# Patient Record
Sex: Female | Born: 1970 | Race: Black or African American | Hispanic: No | Marital: Married | Smoking: Never smoker
Health system: Southern US, Community
[De-identification: ages and names within clinical notes are randomized; demographics above are authoritative.]

## PROBLEM LIST (undated history)

## (undated) DIAGNOSIS — F419 Anxiety disorder, unspecified: Secondary | ICD-10-CM

## (undated) DIAGNOSIS — U071 COVID-19: Secondary | ICD-10-CM

## (undated) HISTORY — PX: LAPAROSCOPIC GASTRIC SLEEVE RESECTION: SHX5895

---

## 1999-06-03 HISTORY — PX: DILATION AND CURETTAGE OF UTERUS: SHX78

## 2004-06-02 HISTORY — PX: LIPOMA EXCISION: SHX5283

## 2007-07-22 ENCOUNTER — Encounter: Admission: RE | Admit: 2007-07-22 | Discharge: 2007-07-22 | Payer: Self-pay | Admitting: Internal Medicine

## 2007-09-25 ENCOUNTER — Emergency Department (HOSPITAL_COMMUNITY): Admission: EM | Admit: 2007-09-25 | Discharge: 2007-09-25 | Payer: Self-pay | Admitting: Emergency Medicine

## 2007-12-10 ENCOUNTER — Ambulatory Visit (HOSPITAL_COMMUNITY): Admission: RE | Admit: 2007-12-10 | Discharge: 2007-12-10 | Payer: Self-pay | Admitting: Obstetrics and Gynecology

## 2008-02-02 ENCOUNTER — Encounter: Admission: RE | Admit: 2008-02-02 | Discharge: 2008-02-02 | Payer: Self-pay | Admitting: Internal Medicine

## 2008-08-03 ENCOUNTER — Encounter: Admission: RE | Admit: 2008-08-03 | Discharge: 2008-08-03 | Payer: Self-pay | Admitting: Internal Medicine

## 2009-02-07 ENCOUNTER — Emergency Department (HOSPITAL_COMMUNITY): Admission: EM | Admit: 2009-02-07 | Discharge: 2009-02-07 | Payer: Self-pay | Admitting: Family Medicine

## 2010-06-24 ENCOUNTER — Encounter: Payer: Self-pay | Admitting: Internal Medicine

## 2010-07-09 ENCOUNTER — Other Ambulatory Visit: Payer: Self-pay | Admitting: Obstetrics and Gynecology

## 2010-07-09 DIAGNOSIS — N6320 Unspecified lump in the left breast, unspecified quadrant: Secondary | ICD-10-CM

## 2010-07-12 ENCOUNTER — Ambulatory Visit
Admission: RE | Admit: 2010-07-12 | Discharge: 2010-07-12 | Disposition: A | Discharge status: Home / Self Care | Payer: BC Managed Care – PPO | Source: Ambulatory Visit | Attending: Obstetrics and Gynecology | Admitting: Obstetrics and Gynecology

## 2010-07-12 DIAGNOSIS — N6320 Unspecified lump in the left breast, unspecified quadrant: Secondary | ICD-10-CM

## 2011-04-22 ENCOUNTER — Other Ambulatory Visit: Payer: Self-pay | Admitting: Obstetrics and Gynecology

## 2011-04-30 ENCOUNTER — Encounter (HOSPITAL_COMMUNITY): Payer: Self-pay | Admitting: Pharmacist

## 2011-05-07 ENCOUNTER — Encounter (HOSPITAL_COMMUNITY)
Admission: RE | Admit: 2011-05-07 | Discharge: 2011-05-07 | Disposition: A | Discharge status: Home / Self Care | Payer: BC Managed Care – PPO | Source: Ambulatory Visit | Attending: Obstetrics and Gynecology | Admitting: Obstetrics and Gynecology

## 2011-05-07 ENCOUNTER — Encounter (HOSPITAL_COMMUNITY): Payer: Self-pay

## 2011-05-07 HISTORY — DX: Anxiety disorder, unspecified: F41.9

## 2011-05-07 LAB — CBC
Hemoglobin: 11.5 g/dL — ABNORMAL LOW (ref 12.0–15.0)
MCHC: 33.8 g/dL (ref 30.0–36.0)
RBC: 4.6 MIL/uL (ref 3.87–5.11)

## 2011-05-07 LAB — SURGICAL PCR SCREEN
MRSA, PCR: NEGATIVE
Staphylococcus aureus: NEGATIVE

## 2011-05-07 NOTE — Patient Instructions (Addendum)
22 Katie Curry  05/07/2011   Your procedure is scheduled on:  05/14/11 09:45  Enter through the Main Entrance of [facility] at 8:15 AM.  Pick up the phone at the desk and dial [phone].   Call this number if you have problems the morning of surgery: [phone]   Remember:   Do not eat food:After Midnight.  Do not drink clear liquids: After Midnight.  Take these medicines the morning of surgery with A SIP OF WATER: NA   Do not wear jewelry, make-up or nail polish.  Do not wear lotions, powders, or perfumes. You may wear deodorant.  Do not shave 48 hours prior to surgery.  Do not bring valuables to the hospital.  Contacts, dentures or bridgework may not be worn into surgery.  Leave suitcase in the car. After surgery it may be brought to your room.  For patients admitted to the hospital, checkout time is 11:00 AM the day of discharge.   Patients discharged the day of surgery will not be allowed to drive home.  Name and phone number of your driver: NA  Special Instructions: CHG Shower Use Special Wash: 1/2 bottle night before surgery and 1/2 bottle morning of surgery.   Please read over the following fact sheets that you were given: MRSA Information

## 2011-05-13 MED ORDER — DEXTROSE 5 % IV SOLN
2.0000 g | INTRAVENOUS | Status: AC
  Administered 2011-05-14: 2 g via INTRAVENOUS
  Filled 2011-05-13: qty 2

## 2011-05-13 NOTE — H&P (Signed)
40 y.o. yo complains of SUI and cystocele. SUI getting worse.  Wearing pad all the time.  She leaks with sneeze, laughing and any type of exertion.  PT did not help.  Cystometrics done- showed LPP 62.  She also has urge symptoms but Gala Murdoch does not help with the leaking urine.    Past Medical History  Diagnosis Date  . Anxiety     no meds  PGYn:  Pt has hx of abnormal paps.  She has a Mirena in place. Past Surgical History  Procedure Date  . Lipoma excision 2006  . Dilation and curettage of uterus 2001    Missed AB    History   Social History  . Marital Status: Divorced    Spouse Name: N/A    Number of Children: N/A  . Years of Education: N/A   Occupational History  . Not on file.   Social History Main Topics  . Smoking status: Never Smoker   . Smokeless tobacco: Not on file  . Alcohol Use: Yes     occasionaly  . Drug Use: No  . Sexually Active:    Other Topics Concern  . Not on file   Social History Narrative  . No narrative on file    No current facility-administered medications on file prior to encounter.   No current outpatient prescriptions on file prior to encounter.    Allergies  Allergen Reactions  . Azithromycin Hives  . Food Hives and Swelling    [place] nuts    Vitals P  Lungs: clear to ascultation Cor:  RRR Abdomen:  soft, nontender, nondistended. Ex:  no cords, erythema Pelvic:  NEFG, nml s/s av uterus, no masses.  45 degree urethral movement.  Cystocele to -1.   Mild rectocele without splinting.  Uterus is suspended.  A:  SUI and mixed incontinence.  Desires definitive.  Does not desire fertility.     P:   All risks, benefits and alternatives d/w patient and she desires to proceed with a TVT and Arepair .  Pt will retain uterus if the uterus is still well suspended under anesthesia.  Pt understands will need to keep good birth control after operation (a future pregnancy could compromise repair) and she will need to continue regular   papsmears because of history of CIN I  if she retains her uterus. Patient  will receive preop antibiotics and SCDs during the operation.     Porche Steinberger A

## 2011-05-14 ENCOUNTER — Ambulatory Visit (HOSPITAL_COMMUNITY): Payer: BC Managed Care – PPO | Admitting: Anesthesiology

## 2011-05-14 ENCOUNTER — Encounter (HOSPITAL_COMMUNITY): Payer: Self-pay | Admitting: Anesthesiology

## 2011-05-14 ENCOUNTER — Other Ambulatory Visit: Payer: Self-pay | Admitting: Obstetrics and Gynecology

## 2011-05-14 ENCOUNTER — Ambulatory Visit (HOSPITAL_COMMUNITY)
Admission: RE | Admit: 2011-05-14 | Discharge: 2011-05-15 | Disposition: A | Discharge status: Home / Self Care | Payer: BC Managed Care – PPO | Source: Ambulatory Visit | Attending: Obstetrics and Gynecology | Admitting: Obstetrics and Gynecology

## 2011-05-14 ENCOUNTER — Encounter (HOSPITAL_COMMUNITY): Payer: Self-pay | Admitting: *Deleted

## 2011-05-14 ENCOUNTER — Encounter (HOSPITAL_COMMUNITY)
Admission: RE | Disposition: A | Discharge status: Home / Self Care | Payer: Self-pay | Source: Ambulatory Visit | Attending: Obstetrics and Gynecology

## 2011-05-14 DIAGNOSIS — N393 Stress incontinence (female) (male): Secondary | ICD-10-CM | POA: Insufficient documentation

## 2011-05-14 DIAGNOSIS — N812 Incomplete uterovaginal prolapse: Secondary | ICD-10-CM | POA: Insufficient documentation

## 2011-05-14 DIAGNOSIS — Z01818 Encounter for other preprocedural examination: Secondary | ICD-10-CM | POA: Insufficient documentation

## 2011-05-14 DIAGNOSIS — Z9889 Other specified postprocedural states: Secondary | ICD-10-CM

## 2011-05-14 DIAGNOSIS — Z01812 Encounter for preprocedural laboratory examination: Secondary | ICD-10-CM | POA: Insufficient documentation

## 2011-05-14 HISTORY — PX: CYSTOSCOPY: SHX5120

## 2011-05-14 HISTORY — PX: BLADDER SUSPENSION: SHX72

## 2011-05-14 HISTORY — PX: CYSTOCELE REPAIR: SHX163

## 2011-05-14 HISTORY — PX: VAGINAL HYSTERECTOMY: SHX2639

## 2011-05-14 LAB — HCG, SERUM, QUALITATIVE: Preg, Serum: NEGATIVE

## 2011-05-14 SURGERY — TRANSVAGINAL TAPE (TVT) PROCEDURE
Anesthesia: General | Site: Vagina | Wound class: Clean Contaminated

## 2011-05-14 MED ORDER — PROPOFOL 10 MG/ML IV EMUL
INTRAVENOUS | Status: DC | PRN
  Administered 2011-05-14: 200 mg via INTRAVENOUS

## 2011-05-14 MED ORDER — HYDROMORPHONE HCL PF 1 MG/ML IJ SOLN
INTRAMUSCULAR | Status: AC
  Administered 2011-05-14: 0.5 mg via INTRAVENOUS
  Filled 2011-05-14: qty 1

## 2011-05-14 MED ORDER — ONDANSETRON HCL 4 MG PO TABS: 4.0000 mg | ORAL_TABLET | Freq: Four times a day (QID) | ORAL | Status: DC | PRN

## 2011-05-14 MED ORDER — LIDOCAINE-EPINEPHRINE 0.5-1:200000 % IJ SOLN
INTRAMUSCULAR | Status: DC | PRN
  Administered 2011-05-14: 14 mL

## 2011-05-14 MED ORDER — FESOTERODINE FUMARATE ER 8 MG PO TB24: 8.0000 mg | ORAL_TABLET | Freq: Every day | ORAL | Status: DC

## 2011-05-14 MED ORDER — GLYCOPYRROLATE 0.2 MG/ML IJ SOLN
INTRAMUSCULAR | Status: AC
  Filled 2011-05-14: qty 1

## 2011-05-14 MED ORDER — MIDAZOLAM HCL 5 MG/5ML IJ SOLN
INTRAMUSCULAR | Status: DC | PRN
  Administered 2011-05-14: 2 mg via INTRAVENOUS

## 2011-05-14 MED ORDER — NEOSTIGMINE METHYLSULFATE 1 MG/ML IJ SOLN
INTRAMUSCULAR | Status: DC | PRN
  Administered 2011-05-14: 2 mg via INTRAVENOUS

## 2011-05-14 MED ORDER — ESTRADIOL 0.1 MG/GM VA CREA
TOPICAL_CREAM | VAGINAL | Status: DC | PRN
  Administered 2011-05-14: 1 via VAGINAL

## 2011-05-14 MED ORDER — ONDANSETRON HCL 4 MG/2ML IJ SOLN
4.0000 mg | Freq: Four times a day (QID) | INTRAMUSCULAR | Status: DC | PRN
  Filled 2011-05-14: qty 2

## 2011-05-14 MED ORDER — ROCURONIUM BROMIDE 100 MG/10ML IV SOLN
INTRAVENOUS | Status: DC | PRN
  Administered 2011-05-14: 40 mg via INTRAVENOUS

## 2011-05-14 MED ORDER — ZOLPIDEM TARTRATE 5 MG PO TABS: 5.0000 mg | ORAL_TABLET | Freq: Every evening | ORAL | Status: DC | PRN

## 2011-05-14 MED ORDER — FENTANYL CITRATE 0.05 MG/ML IJ SOLN
INTRAMUSCULAR | Status: DC | PRN
  Administered 2011-05-14 (×3): 100 ug via INTRAVENOUS
  Administered 2011-05-14: 50 ug via INTRAVENOUS

## 2011-05-14 MED ORDER — MIDAZOLAM HCL 2 MG/2ML IJ SOLN
INTRAMUSCULAR | Status: AC
  Filled 2011-05-14: qty 2

## 2011-05-14 MED ORDER — LACTATED RINGERS IV SOLN
INTRAVENOUS | Status: DC
  Administered 2011-05-14 – 2011-05-15 (×3): via INTRAVENOUS

## 2011-05-14 MED ORDER — FENTANYL CITRATE 0.05 MG/ML IJ SOLN
INTRAMUSCULAR | Status: AC
  Filled 2011-05-14: qty 5

## 2011-05-14 MED ORDER — LACTATED RINGERS IV SOLN
INTRAVENOUS | Status: DC
  Administered 2011-05-14 (×3): via INTRAVENOUS

## 2011-05-14 MED ORDER — ONDANSETRON HCL 4 MG/2ML IJ SOLN
4.0000 mg | Freq: Four times a day (QID) | INTRAMUSCULAR | Status: DC | PRN
  Administered 2011-05-14: 4 mg via INTRAVENOUS

## 2011-05-14 MED ORDER — DIPHENHYDRAMINE HCL 50 MG/ML IJ SOLN: 12.5000 mg | Freq: Four times a day (QID) | INTRAMUSCULAR | Status: DC | PRN

## 2011-05-14 MED ORDER — OXYCODONE-ACETAMINOPHEN 5-325 MG PO TABS
1.0000 | ORAL_TABLET | ORAL | Status: DC | PRN
  Administered 2011-05-15: 2 via ORAL
  Administered 2011-05-15: 1 via ORAL
  Filled 2011-05-14: qty 1
  Filled 2011-05-14: qty 2

## 2011-05-14 MED ORDER — NEOSTIGMINE METHYLSULFATE 1 MG/ML IJ SOLN
INTRAMUSCULAR | Status: AC
  Filled 2011-05-14: qty 10

## 2011-05-14 MED ORDER — KETOROLAC TROMETHAMINE 30 MG/ML IJ SOLN: 30.0000 mg | Freq: Four times a day (QID) | INTRAMUSCULAR | Status: DC

## 2011-05-14 MED ORDER — ONDANSETRON HCL 4 MG/2ML IJ SOLN
INTRAMUSCULAR | Status: AC
  Filled 2011-05-14: qty 2

## 2011-05-14 MED ORDER — SUMATRIPTAN SUCCINATE 100 MG PO TABS
100.0000 mg | ORAL_TABLET | ORAL | Status: DC | PRN
  Filled 2011-05-14: qty 1

## 2011-05-14 MED ORDER — HYDROMORPHONE 0.3 MG/ML IV SOLN
INTRAVENOUS | Status: AC
  Administered 2011-05-14: 0.79 mg via INTRAVENOUS
  Administered 2011-05-14: 14:00:00 via INTRAVENOUS
  Administered 2011-05-14: 0.799 mg via INTRAVENOUS
  Administered 2011-05-15: 0.4 mg via INTRAVENOUS
  Administered 2011-05-15: 0.799 mg via INTRAVENOUS
  Filled 2011-05-14: qty 25

## 2011-05-14 MED ORDER — KETOROLAC TROMETHAMINE 30 MG/ML IJ SOLN
INTRAMUSCULAR | Status: AC
  Filled 2011-05-14: qty 1

## 2011-05-14 MED ORDER — KETOROLAC TROMETHAMINE 30 MG/ML IJ SOLN
INTRAMUSCULAR | Status: DC | PRN
  Administered 2011-05-14: 30 mg via INTRAVENOUS

## 2011-05-14 MED ORDER — ONDANSETRON HCL 4 MG/2ML IJ SOLN
INTRAMUSCULAR | Status: DC | PRN
  Administered 2011-05-14: 4 mg via INTRAVENOUS

## 2011-05-14 MED ORDER — STERILE WATER FOR IRRIGATION IR SOLN
Status: DC | PRN
  Administered 2011-05-14: 2000 mL

## 2011-05-14 MED ORDER — NALOXONE HCL 0.4 MG/ML IJ SOLN: 0.4000 mg | INTRAMUSCULAR | Status: DC | PRN

## 2011-05-14 MED ORDER — SODIUM CHLORIDE 0.9 % IJ SOLN: 9.0000 mL | INTRAMUSCULAR | Status: DC | PRN

## 2011-05-14 MED ORDER — LIDOCAINE HCL (CARDIAC) 20 MG/ML IV SOLN
INTRAVENOUS | Status: AC
  Filled 2011-05-14: qty 5

## 2011-05-14 MED ORDER — ROCURONIUM BROMIDE 50 MG/5ML IV SOLN
INTRAVENOUS | Status: AC
  Filled 2011-05-14: qty 1

## 2011-05-14 MED ORDER — FENTANYL CITRATE 0.05 MG/ML IJ SOLN
INTRAMUSCULAR | Status: AC
  Filled 2011-05-14: qty 2

## 2011-05-14 MED ORDER — HYDROMORPHONE HCL PF 1 MG/ML IJ SOLN
0.2500 mg | INTRAMUSCULAR | Status: DC | PRN
  Administered 2011-05-14 (×2): 0.5 mg via INTRAVENOUS

## 2011-05-14 MED ORDER — MENTHOL 3 MG MT LOZG
1.0000 | LOZENGE | OROMUCOSAL | Status: DC | PRN
  Administered 2011-05-15: 3 mg via ORAL
  Filled 2011-05-14: qty 9

## 2011-05-14 MED ORDER — PROPOFOL 10 MG/ML IV EMUL
INTRAVENOUS | Status: AC
  Filled 2011-05-14: qty 20

## 2011-05-14 MED ORDER — LIDOCAINE HCL (CARDIAC) 20 MG/ML IV SOLN
INTRAVENOUS | Status: DC | PRN
  Administered 2011-05-14: 60 mg via INTRAVENOUS

## 2011-05-14 MED ORDER — GLYCOPYRROLATE 0.2 MG/ML IJ SOLN
INTRAMUSCULAR | Status: DC | PRN
  Administered 2011-05-14: .4 mg via INTRAVENOUS

## 2011-05-14 MED ORDER — DIPHENHYDRAMINE HCL 12.5 MG/5ML PO ELIX: 12.5000 mg | ORAL_SOLUTION | Freq: Four times a day (QID) | ORAL | Status: DC | PRN

## 2011-05-14 MED ORDER — IBUPROFEN 800 MG PO TABS
800.0000 mg | ORAL_TABLET | Freq: Three times a day (TID) | ORAL | Status: DC | PRN
  Administered 2011-05-15: 800 mg via ORAL
  Filled 2011-05-14: qty 1

## 2011-05-14 SURGICAL SUPPLY — 46 items
BLADE SURG 15 STRL LF C SS BP (BLADE) ×4 IMPLANT
BLADE SURG 15 STRL SS (BLADE) ×1
CANISTER SUCTION 2500CC (MISCELLANEOUS) ×5 IMPLANT
CATH BONANNO SUPRAPUBIC 14G (CATHETERS) IMPLANT
CATH ROBINSON RED A/P 16FR (CATHETERS) ×5 IMPLANT
CLOTH BEACON ORANGE TIMEOUT ST (SAFETY) ×5 IMPLANT
CONT PATH 16OZ SNAP LID 3702 (MISCELLANEOUS) IMPLANT
DECANTER SPIKE VIAL GLASS SM (MISCELLANEOUS) ×5 IMPLANT
DERMABOND ADVANCED (GAUZE/BANDAGES/DRESSINGS) ×1
DERMABOND ADVANCED .7 DNX12 (GAUZE/BANDAGES/DRESSINGS) ×4 IMPLANT
DRAPE CAMERA CLOSED 9X96 (DRAPES) ×5 IMPLANT
DRAPE HYSTEROSCOPY (DRAPE) ×5 IMPLANT
FORMULA 20CAL 3 OZ MEAD (FORMULA) ×5 IMPLANT
GAUZE PACKING 2X5 YD STERILE (GAUZE/BANDAGES/DRESSINGS) IMPLANT
GLOVE BIO SURGEON STRL SZ7 (GLOVE) ×10 IMPLANT
GLOVE BIOGEL PI IND STRL 6.5 (GLOVE) ×4 IMPLANT
GLOVE BIOGEL PI INDICATOR 6.5 (GLOVE) ×1
GOWN PREVENTION PLUS LG XLONG (DISPOSABLE) ×30 IMPLANT
GOWN STRL REIN XL XLG (GOWN DISPOSABLE) ×5 IMPLANT
LEGGING LITHOTOMY PAIR STRL (DRAPES) ×5 IMPLANT
NEEDLE HYPO 22GX1.5 SAFETY (NEEDLE) ×5 IMPLANT
NEEDLE SPNL 22GX3.5 QUINCKE BK (NEEDLE) ×5 IMPLANT
NS IRRIG 1000ML POUR BTL (IV SOLUTION) ×5 IMPLANT
PACK VAGINAL WOMENS (CUSTOM PROCEDURE TRAY) ×5 IMPLANT
PENCIL BUTTON HOLSTER BLD 10FT (ELECTRODE) ×5 IMPLANT
PLUG CATH AND CAP STER (CATHETERS) ×5 IMPLANT
SET CYSTO W/LG BORE CLAMP LF (SET/KITS/TRAYS/PACK) ×5 IMPLANT
SLING TRANS VAGINAL TAPE (Sling) ×1 IMPLANT
SLING UTERINE/ABD GYNECARE TVT (Sling) ×4 IMPLANT
SUT SILK 3 0 SH CR/8 (SUTURE) IMPLANT
SUT VIC AB 0 CT1 18XCR BRD8 (SUTURE) ×12 IMPLANT
SUT VIC AB 0 CT1 27 (SUTURE) ×2
SUT VIC AB 0 CT1 27XBRD ANBCTR (SUTURE) ×8 IMPLANT
SUT VIC AB 0 CT1 8-18 (SUTURE) ×3
SUT VIC AB 2-0 CT1 (SUTURE) ×10 IMPLANT
SUT VIC AB 2-0 CT1 27 (SUTURE) ×2
SUT VIC AB 2-0 CT1 TAPERPNT 27 (SUTURE) ×8 IMPLANT
SUT VIC AB 2-0 SH 27 (SUTURE) ×2
SUT VIC AB 2-0 SH 27XBRD (SUTURE) ×8 IMPLANT
SUT VIC AB 2-0 UR6 27 (SUTURE) IMPLANT
SUT VICRYL 0 TIES 12 18 (SUTURE) ×5 IMPLANT
SYR 50ML LL SCALE MARK (SYRINGE) IMPLANT
SYRINGE TOOMEY DISP (SYRINGE) IMPLANT
TOWEL OR 17X24 6PK STRL BLUE (TOWEL DISPOSABLE) ×10 IMPLANT
TRAY FOLEY CATH 14FR (SET/KITS/TRAYS/PACK) ×5 IMPLANT
WATER STERILE IRR 1000ML POUR (IV SOLUTION) IMPLANT

## 2011-05-14 NOTE — Op Note (Signed)
05/14/2011  11:57 AM  PATIENT:  Katie Curry  40 y.o. female  PRE-OPERATIVE DIAGNOSIS:  SUI CYSTOCELE  POST-OPERATIVE DIAGNOSIS:  Stress Urinary incontinence, cystocele, uterine prolapse  PROCEDURE:  Procedure(s): TRANSVAGINAL TAPE (TVT) PROCEDURE HYSTERECTOMY VAGINAL ANTERIOR REPAIR (CYSTOCELE) CYSTOSCOPY  SURGEON:  Surgeon(s): Sosaia Pittinger A Valin Massie W Scott Bowie  PHYSICIAN ASSISTANT:   ASSISTANTS: Dr. Bowie   ANESTHESIA:   general  EBL:  Total I/O In: 1000 [I.V.:1000] Out: 150 [Urine:50; Blood:100]  BLOOD ADMINISTERED:none  LOCAL MEDICATIONS USED:  XYLOCAINE 10CC  SPECIMEN:  Source of Specimen:  uterus, cervix  DISPOSITION OF SPECIMEN:  PATHOLOGY  COUNTS:  YES  DICTATION: see below  PLAN OF CARE: 24 hr obs  PATIENT DISPOSITION:  PACU - hemodynamically stable.   Delay start of Pharmacological VTE agent (>24hrs) due to surgical blood loss or risk of bleeding:  {n/a  Meds:  Similac in bladder, lidocaine with epi, indigo carmine, estrace.  Findings: cystocele to -1, 7 week size uterus that descended anteriorly to -1 and normal ovaries.  Technique:  After general anesthesia was achieved, the patient was prepped and draped in a sterile fashion.  The bladder was emptied with a red rubber catheter and 60 cc of Similac was placed in the bladder. The cervix was grasped with a pair of Lahey clamps and injected circumferentially with 0.5% lidocaine with epi. A circumferential incision was made around the cervix with the scalpel at the level of the reflection of the vagina onto the cervix and the posterior cul-de-sac was entered into with Mayo scissors. The long billed duckbill retractor was then placed and the bladder was removed off the cervix carefully with sharp dissection with the Metzenbaums. The the uterosacrals were grasped with a pair heney clamps on either side and secured with a Heaney stitch of 0 Vicryl. Cardinal ligament was then divided with alternating  successive bites of the Heaney clamp followed by incision with the Mayo scissors and secured with stitches of 0 Vicryl at the level of the cornua Heaneys were placed bilaterally around the entire pedicle and the uterus was able to be amputated. The pedicles were secured with a free hand stitch of 0 Vicryl followed by a stitch of 0 Vicryl bilaterally. An additional stitch on the patient's left-hand side was placed to ensure hemostasis. Once hemostasis was achieved the peritoneum was closed in a pursestring fashion including the bilateral uterosacrals and posteriorly in and out of the vagina and a partial Halbans culdoplasty.  The stitch was pulled shot closing the peritoneum and pulling the uterosacrals together through the modified Hall bands and through the vagina. The Foley was placed at this point.  The anterior vaginal mucosa was then entered into in the midline with the help of Alice's after being injected with 0.5% lidocaine with epi with the scalpel. The vaginal mucosa was then reflected off of the vesicouterine fascia with careful sharp dissection with the Metzenbaums until the pelvic floor could feel be felt underneath the pubic bone. 2 small stab incisions are made on 2 cm either side of the midline just above the pubic bone. The abdominal needles were placed carefully through the pelvic floor and out the vagina. The Foley was removed and the cystoscope placed inside the bladder. Although the there were no needles in the bladder and the ureters were spilling indigo carmine once the TVT had been placed on the abdominal needles attached to the vaginal needles and pulled through it was noticed that the sling was not sitting mid-urethral, but   rather much smaller superior towards the bladder neck. At this point the sling was able to be removed with simple tension through the vagina and an additional TVT was obtained. This time the needles were very carefully placed behind the pubic bone and rocked all the way  forward after the deep pubic bone was cleared so that I could place the sling mid-urethral. The cystoscopy was informed again and again there were no needles in the bladder and there was good spillage of indigo carmine from the bilateral ureteral orifices. The abdominal needles were attached the vaginal needles and the tape pulled through and cut into place. A Kelly was used to ensure that the there was no tension placed on the tape. Once I was satisfied that the sling was in the correct place and at no tension, the anterior repair was done with 2 mattress stitches of 0 Vicryl. Hemostasis was achieved with the Bovie cautery and some Gelfoam. The vaginal mucosa was trimmed and closed with a running locked stitch of 2-0 Vicryl. The cuff was then closed with interrupted figure-of-eight stitches of 0 Vicryl. A vaginal pack was placed inside the vagina with Estrace cream and the Foley had been replaced patient tolerated the procedure was returned to recovery in stable condition.  Idelle Reimann A      

## 2011-05-14 NOTE — Anesthesia Postprocedure Evaluation (Signed)
  Anesthesia Post-op Note  Patient: Katie Curry  Procedure(s) Performed:  TRANSVAGINAL TAPE (TVT) PROCEDURE; HYSTERECTOMY VAGINAL; ANTERIOR REPAIR (CYSTOCELE); CYSTOSCOPY  Patient is awake and responsive. Pain and nausea are reasonably well controlled. Vital signs are stable and clinically acceptable. Oxygen saturation is clinically acceptable. There are no apparent anesthetic complications at this time. Patient is ready for discharge.

## 2011-05-14 NOTE — Transfer of Care (Signed)
Immediate Anesthesia Transfer of Care Note  Patient: Katie Curry  Procedure(s) Performed:  TRANSVAGINAL TAPE (TVT) PROCEDURE; HYSTERECTOMY VAGINAL; ANTERIOR REPAIR (CYSTOCELE); CYSTOSCOPY  Patient Location: PACU  Anesthesia Type: General  Level of Consciousness: awake  Airway & Oxygen Therapy: Patient Spontanous Breathing  Post-op Assessment: Report given to PACU RN  Post vital signs: Reviewed and stable  Complications: No apparent anesthesia complications

## 2011-05-14 NOTE — Progress Notes (Signed)
Patient is eating, ambulating, has foley in.  Pain control is good.  BP 137/80  Pulse 86  Temp(Src) 97.5 F (36.4 C) (Oral)  Resp 18  Ht 5' 6.5" (1.689 m)  Wt 120.203 kg (265 lb)  BMI 42.13 kg/m2  SpO2 98%  lungs:   clear to auscultation cor:    RRR Abdomen:  soft, appropriate tenderness, incisions intact and without erythema or exudate. ex:    no cords   Lab Results  Component Value Date   WBC 6.4 05/07/2011   HGB 11.5* 05/07/2011   HCT 34.0* 05/07/2011   MCV 73.9* 05/07/2011   PLT 279 05/07/2011    A/P  Routine care.  Expect d/c per plan.  Vag Pack in place.

## 2011-05-14 NOTE — Brief Op Note (Signed)
05/14/2011  11:57 AM  PATIENT:  Katie Curry  40 y.o. female  PRE-OPERATIVE DIAGNOSIS:  SUI CYSTOCELE  POST-OPERATIVE DIAGNOSIS:  Stress Urinary incontinence, cystocele, uterine prolapse  PROCEDURE:  Procedure(s): TRANSVAGINAL TAPE (TVT) PROCEDURE HYSTERECTOMY VAGINAL ANTERIOR REPAIR (CYSTOCELE) CYSTOSCOPY  SURGEON:  Surgeon(s): Elray Mcgregor  PHYSICIAN ASSISTANT:   ASSISTANTS: Dr. Greta Doom   ANESTHESIA:   general  EBL:  Total I/O In: 1000 [I.V.:1000] Out: 150 [Urine:50; Blood:100]  BLOOD ADMINISTERED:none  LOCAL MEDICATIONS USED:  XYLOCAINE 10CC  SPECIMEN:  Source of Specimen:  uterus, cervix  DISPOSITION OF SPECIMEN:  PATHOLOGY  COUNTS:  YES  DICTATION: see below  PLAN OF CARE: 24 hr obs  PATIENT DISPOSITION:  PACU - hemodynamically stable.   Delay start of Pharmacological VTE agent (>24hrs) due to surgical blood loss or risk of bleeding:  {n/a  Meds:  Similac in bladder, lidocaine with epi, indigo carmine, estrace.  Findings: cystocele to -1, 7 week size uterus that descended anteriorly to -1 and normal ovaries.  Technique:  After general anesthesia was achieved, the patient was prepped and draped in a sterile fashion.  The bladder was emptied with a red rubber catheter and 60 cc of Similac was placed in the bladder. The cervix was grasped with a pair of Lahey clamps and injected circumferentially with 0.5% lidocaine with epi. A circumferential incision was made around the cervix with the scalpel at the level of the reflection of the vagina onto the cervix and the posterior cul-de-sac was entered into with Mayo scissors. The long billed duckbill retractor was then placed and the bladder was removed off the cervix carefully with sharp dissection with the Metzenbaums. The the uterosacrals were grasped with a pair heney clamps on either side and secured with a Heaney stitch of 0 Vicryl. Cardinal ligament was then divided with alternating  successive bites of the Heaney clamp followed by incision with the Mayo scissors and secured with stitches of 0 Vicryl at the level of the cornua Heaneys were placed bilaterally around the entire pedicle and the uterus was able to be amputated. The pedicles were secured with a free hand stitch of 0 Vicryl followed by a stitch of 0 Vicryl bilaterally. An additional stitch on the patient's left-hand side was placed to ensure hemostasis. Once hemostasis was achieved the peritoneum was closed in a pursestring fashion including the bilateral uterosacrals and posteriorly in and out of the vagina and a partial Halbans culdoplasty.  The stitch was pulled shot closing the peritoneum and pulling the uterosacrals together through the modified Hall bands and through the vagina. The Foley was placed at this point.  The anterior vaginal mucosa was then entered into in the midline with the help of Alice's after being injected with 0.5% lidocaine with epi with the scalpel. The vaginal mucosa was then reflected off of the vesicouterine fascia with careful sharp dissection with the Metzenbaums until the pelvic floor could feel be felt underneath the pubic bone. 2 small stab incisions are made on 2 cm either side of the midline just above the pubic bone. The abdominal needles were placed carefully through the pelvic floor and out the vagina. The Foley was removed and the cystoscope placed inside the bladder. Although the there were no needles in the bladder and the ureters were spilling indigo carmine once the TVT had been placed on the abdominal needles attached to the vaginal needles and pulled through it was noticed that the sling was not sitting mid-urethral, but  rather much smaller superior towards the bladder neck. At this point the sling was able to be removed with simple tension through the vagina and an additional TVT was obtained. This time the needles were very carefully placed behind the pubic bone and rocked all the way  forward after the deep pubic bone was cleared so that I could place the sling mid-urethral. The cystoscopy was informed again and again there were no needles in the bladder and there was good spillage of indigo carmine from the bilateral ureteral orifices. The abdominal needles were attached the vaginal needles and the tape pulled through and cut into place. A Tresa Endo was used to ensure that the there was no tension placed on the tape. Once I was satisfied that the sling was in the correct place and at no tension, the anterior repair was done with 2 mattress stitches of 0 Vicryl. Hemostasis was achieved with the Bovie cautery and some Gelfoam. The vaginal mucosa was trimmed and closed with a running locked stitch of 2-0 Vicryl. The cuff was then closed with interrupted figure-of-eight stitches of 0 Vicryl. A vaginal pack was placed inside the vagina with Estrace cream and the Foley had been replaced patient tolerated the procedure was returned to recovery in stable condition.  Imani Fiebelkorn A

## 2011-05-14 NOTE — Anesthesia Preprocedure Evaluation (Addendum)
Anesthesia Evaluation    Airway       Dental   Pulmonary          Cardiovascular     Neuro/Psych    GI/Hepatic   Endo/Other  Morbid obesity  Renal/GU      Musculoskeletal   Abdominal   Peds  Hematology   Anesthesia Other Findings   Reproductive/Obstetrics                           Anesthesia Physical Anesthesia Plan  ASA: III  Anesthesia Plan: General   Post-op Pain Management:    Induction: Intravenous  Airway Management Planned: Oral ETT  Additional Equipment:   Intra-op Plan:   Post-operative Plan:   Informed Consent: I have reviewed the patients History and Physical, chart, labs and discussed the procedure including the risks, benefits and alternatives for the proposed anesthesia with the patient or authorized representative who has indicated his/her understanding and acceptance.   Dental Advisory Given and Dental advisory given  Plan Discussed with: CRNA and Surgeon  Anesthesia Plan Comments: (  Discussed  general anesthesia, including possible nausea, instrumentation of airway, sore throat,pulmonary aspiration, etc. I asked if the were any outstanding questions, or  concerns before we proceeded. )        Anesthesia Quick Evaluation

## 2011-05-14 NOTE — Anesthesia Procedure Notes (Signed)
Procedure Name: Intubation Date/Time: 05/14/2011 10:15 AM Performed by: Carlyle Lipa Pre-anesthesia Checklist: Patient identified, Patient being monitored, Emergency Drugs available, Suction available and Timeout performed Patient Re-evaluated:Patient Re-evaluated prior to inductionOxygen Delivery Method: Circle System Utilized Preoxygenation: Pre-oxygenation with 100% oxygen Intubation Type: IV induction Ventilation: Mask ventilation without difficulty Laryngoscope Size: Miller and 2 Grade View: Grade I Tube type: Oral Tube size: 7.0 mm Number of attempts: 1 Placement Confirmation: ETT inserted through vocal cords under direct vision,  positive ETCO2 and breath sounds checked- equal and bilateral Secured at: 22 cm Tube secured with: Tape Dental Injury: Teeth and Oropharynx as per pre-operative assessment

## 2011-05-14 NOTE — Progress Notes (Signed)
There has been no change in the patients history, status or exam since the history and physical.  Filed Vitals:   05/14/11 0850  BP: 137/87  Pulse: 78  Temp: 97.9 F (36.6 C)  TempSrc: Oral  Resp: 16  Height: 5' 6.5" (1.689 m)  Weight: 120.203 kg (265 lb)  SpO2: 100%    Lab Results  Component Value Date   WBC 6.4 05/07/2011   HGB 11.5* 05/07/2011   HCT 34.0* 05/07/2011   MCV 73.9* 05/07/2011   PLT 279 05/07/2011    Dion Sibal A

## 2011-05-15 ENCOUNTER — Encounter (HOSPITAL_COMMUNITY): Payer: Self-pay | Admitting: Obstetrics and Gynecology

## 2011-05-15 LAB — CBC
MCH: 24.6 pg — ABNORMAL LOW (ref 26.0–34.0)
MCHC: 33.3 g/dL (ref 30.0–36.0)
WBC: 11.6 10*3/uL — ABNORMAL HIGH (ref 4.0–10.5)

## 2011-05-15 MED ORDER — OXYCODONE-ACETAMINOPHEN 5-325 MG PO TABS: 1.0000 | ORAL_TABLET | ORAL | Status: AC | PRN

## 2011-05-15 MED ORDER — CEFUROXIME AXETIL 250 MG PO TABS: 250.0000 mg | ORAL_TABLET | Freq: Two times a day (BID) | ORAL | Status: AC

## 2011-05-15 NOTE — Anesthesia Postprocedure Evaluation (Signed)
  Anesthesia Post-op Note  Patient: Katie Curry  Procedure(s) Performed:  TRANSVAGINAL TAPE (TVT) PROCEDURE; HYSTERECTOMY VAGINAL; ANTERIOR REPAIR (CYSTOCELE); CYSTOSCOPY  Patient Location: PACU and [facility] Unit  Anesthesia Type: General  Level of Consciousness: awake  Airway and Oxygen Therapy: Patient Spontanous Breathing  Post-op Pain: mild  Post-op Assessment: Post-op Vital signs reviewed  Post-op Vital Signs: Reviewed and stable  Complications: No apparent anesthesia complications

## 2011-05-15 NOTE — Discharge Summary (Signed)
Physician Discharge Summary  Patient ID: MALLEY HAUTER MRN: [id number] DOB/AGE: 08-02-1970 40 y.o.  Admit date: 05/14/2011 Discharge date: 05/15/2011  Admission Diagnoses:SUI, cystocele  Discharge Diagnoses: same plus uterine prolapse Active Problems:  * No active hospital problems. *    Discharged Condition: good  Hospital Course: Uncomplicated surgery and post op course.  D/Ced pod 1  Consults: none  Significant Diagnostic Studies: labs:  Lab Results  Component Value Date   WBC 11.6* 05/15/2011   HGB 10.4* 05/15/2011   HCT 31.2* 05/15/2011   MCV 73.9* 05/15/2011   PLT 282 05/15/2011     Treatments: surgery: TVH, TVT, A repair, cystoscopy.  Discharge Exam: Blood pressure 129/71, pulse 87, temperature 98.7 F (37.1 C), temperature source Oral, resp. rate 20, height 5' 6.5" (1.689 m), weight 120.203 kg (265 lb), SpO2 100.00%. see progress note  Disposition: to home with foley- for voiding trial on Monday  Discharge Orders    Future Orders Please Complete By Expires   Diet - low sodium heart healthy      Discharge instructions      Comments:   No driving on narcotics, no sexual activity for 2 weeks.   Increase activity slowly      May shower / Bathe      Comments:   Shower, no bath for 2 weeks.   Sexual Activity Restrictions      Comments:   No sexual activity for 2 weeks.   Remove dressing in 24 hours      Call MD for:  temperature >100.4        Current Discharge Medication List    START taking these medications   Details  cefUROXime (CEFTIN) 250 MG tablet Take 1 tablet (250 mg total) by mouth 2 (two) times daily. Qty: 30 tablet, Refills: 1    oxyCODONE-acetaminophen (ROXICET) 5-325 MG per tablet Take 1 tablet by mouth every 4 (four) hours as needed for pain. Qty: 30 tablet, Refills: 0      CONTINUE these medications which have NOT CHANGED   Details  Fesoterodine Fumarate (TOVIAZ) 8 MG TB24 Take 8 mg by mouth daily.      SUMAtriptan (IMITREX)  100 MG tablet Take 100 mg by mouth every 2 (two) hours as needed. For migraines        Follow-up Information    Follow up with Chasey Dull A. Make an appointment in 4 days.   Contact information:   [address] [place] [address] [phone]          Signed: Loney Laurence 05/15/2011, 8:34 AM

## 2011-05-15 NOTE — Progress Notes (Signed)
Patient is eating, ambulating, has foley in.  Pain control is good.  BP 129/71  Pulse 87  Temp(Src) 98.7 F (37.1 C) (Oral)  Resp 20  Ht 5' 6.5" (1.689 m)  Wt 120.203 kg (265 lb)  BMI 42.13 kg/m2  SpO2 100%  lungs:   clear to auscultation cor:    RRR Abdomen:  soft, appropriate tenderness, incisions intact and without erythema or exudate. ex:    no cords   Lab Results  Component Value Date   WBC 11.6* 05/15/2011   HGB 10.4* 05/15/2011   HCT 31.2* 05/15/2011   MCV 73.9* 05/15/2011   PLT 282 05/15/2011    A/P  Routine care.  Expect d/c per plan r today.

## 2011-05-15 NOTE — Addendum Note (Signed)
Addendum  created 05/15/11 1304 by Cephus Shelling   Modules edited:Notes Section

## 2013-03-30 ENCOUNTER — Other Ambulatory Visit: Payer: Self-pay

## 2013-03-30 DIAGNOSIS — Z1231 Encounter for screening mammogram for malignant neoplasm of breast: Secondary | ICD-10-CM

## 2013-05-02 ENCOUNTER — Ambulatory Visit: Payer: BC Managed Care – PPO

## 2013-05-24 ENCOUNTER — Ambulatory Visit
Admission: RE | Admit: 2013-05-24 | Discharge: 2013-05-24 | Disposition: A | Discharge status: Home / Self Care | Payer: BC Managed Care – PPO | Source: Ambulatory Visit

## 2013-05-24 DIAGNOSIS — Z1231 Encounter for screening mammogram for malignant neoplasm of breast: Secondary | ICD-10-CM

## 2015-02-28 ENCOUNTER — Other Ambulatory Visit: Payer: Self-pay

## 2015-02-28 DIAGNOSIS — Z1231 Encounter for screening mammogram for malignant neoplasm of breast: Secondary | ICD-10-CM

## 2015-03-09 ENCOUNTER — Ambulatory Visit: Payer: Self-pay

## 2015-03-29 ENCOUNTER — Ambulatory Visit
Admission: RE | Admit: 2015-03-29 | Discharge: 2015-03-29 | Disposition: A | Discharge status: Home / Self Care | Payer: 59 | Source: Ambulatory Visit

## 2015-03-29 DIAGNOSIS — Z1231 Encounter for screening mammogram for malignant neoplasm of breast: Secondary | ICD-10-CM

## 2016-02-18 ENCOUNTER — Other Ambulatory Visit: Payer: Self-pay | Admitting: Family Medicine

## 2016-02-18 DIAGNOSIS — IMO0002 Reserved for concepts with insufficient information to code with codable children: Secondary | ICD-10-CM

## 2016-02-18 DIAGNOSIS — K55069 Acute infarction of intestine, part and extent unspecified: Secondary | ICD-10-CM

## 2016-02-18 DIAGNOSIS — N63 Unspecified lump in unspecified breast: Secondary | ICD-10-CM

## 2016-02-22 ENCOUNTER — Ambulatory Visit
Admission: RE | Admit: 2016-02-22 | Discharge: 2016-02-22 | Disposition: A | Discharge status: Home / Self Care | Payer: 59 | Source: Ambulatory Visit | Attending: Family Medicine | Admitting: Family Medicine

## 2016-02-22 DIAGNOSIS — N63 Unspecified lump in unspecified breast: Secondary | ICD-10-CM

## 2016-02-25 ENCOUNTER — Other Ambulatory Visit: Payer: 59

## 2016-07-07 DIAGNOSIS — Z719 Counseling, unspecified: Secondary | ICD-10-CM | POA: Diagnosis not present

## 2016-09-14 DIAGNOSIS — H10023 Other mucopurulent conjunctivitis, bilateral: Secondary | ICD-10-CM | POA: Diagnosis not present

## 2016-10-15 DIAGNOSIS — E559 Vitamin D deficiency, unspecified: Secondary | ICD-10-CM | POA: Diagnosis not present

## 2016-10-15 DIAGNOSIS — G479 Sleep disorder, unspecified: Secondary | ICD-10-CM | POA: Diagnosis not present

## 2016-10-22 DIAGNOSIS — R21 Rash and other nonspecific skin eruption: Secondary | ICD-10-CM | POA: Diagnosis not present

## 2016-11-15 DIAGNOSIS — N390 Urinary tract infection, site not specified: Secondary | ICD-10-CM | POA: Diagnosis not present

## 2016-11-15 DIAGNOSIS — R350 Frequency of micturition: Secondary | ICD-10-CM | POA: Diagnosis not present

## 2017-04-09 DIAGNOSIS — R2230 Localized swelling, mass and lump, unspecified upper limb: Secondary | ICD-10-CM | POA: Diagnosis not present

## 2017-04-09 DIAGNOSIS — N760 Acute vaginitis: Secondary | ICD-10-CM | POA: Diagnosis not present

## 2017-04-09 DIAGNOSIS — R590 Localized enlarged lymph nodes: Secondary | ICD-10-CM | POA: Diagnosis not present

## 2017-04-11 DIAGNOSIS — R300 Dysuria: Secondary | ICD-10-CM | POA: Diagnosis not present

## 2017-04-11 DIAGNOSIS — L729 Follicular cyst of the skin and subcutaneous tissue, unspecified: Secondary | ICD-10-CM | POA: Diagnosis not present

## 2017-04-26 DIAGNOSIS — T7840XA Allergy, unspecified, initial encounter: Secondary | ICD-10-CM | POA: Diagnosis not present

## 2017-04-26 DIAGNOSIS — T368X5A Adverse effect of other systemic antibiotics, initial encounter: Secondary | ICD-10-CM | POA: Diagnosis not present

## 2017-05-05 DIAGNOSIS — L299 Pruritus, unspecified: Secondary | ICD-10-CM | POA: Diagnosis not present

## 2017-06-11 DIAGNOSIS — R350 Frequency of micturition: Secondary | ICD-10-CM | POA: Diagnosis not present

## 2017-06-11 DIAGNOSIS — Z01419 Encounter for gynecological examination (general) (routine) without abnormal findings: Secondary | ICD-10-CM | POA: Diagnosis not present

## 2017-06-11 DIAGNOSIS — N760 Acute vaginitis: Secondary | ICD-10-CM | POA: Diagnosis not present

## 2017-06-23 DIAGNOSIS — N3281 Overactive bladder: Secondary | ICD-10-CM | POA: Diagnosis not present

## 2017-06-23 DIAGNOSIS — N8110 Cystocele, unspecified: Secondary | ICD-10-CM | POA: Diagnosis not present

## 2017-06-23 DIAGNOSIS — N760 Acute vaginitis: Secondary | ICD-10-CM | POA: Diagnosis not present

## 2017-07-14 IMAGING — MG 2D DIGITAL DIAGNOSTIC UNILATERAL RIGHT MAMMOGRAM WITH CAD AND AD
8 of 10 series · 8 of 22 positions shown · non-contrast
Comparison: Previous exam(s).

CLINICAL DATA: 45-year-old female with palpable lump in the right
breast discovered on self-examination.

EXAM:
2D DIGITAL DIAGNOSTIC RIGHT MAMMOGRAM WITH CAD AND ADJUNCT TOMO
ULTRASOUND RIGHT BREAST

[R MLO (1 of 3)]
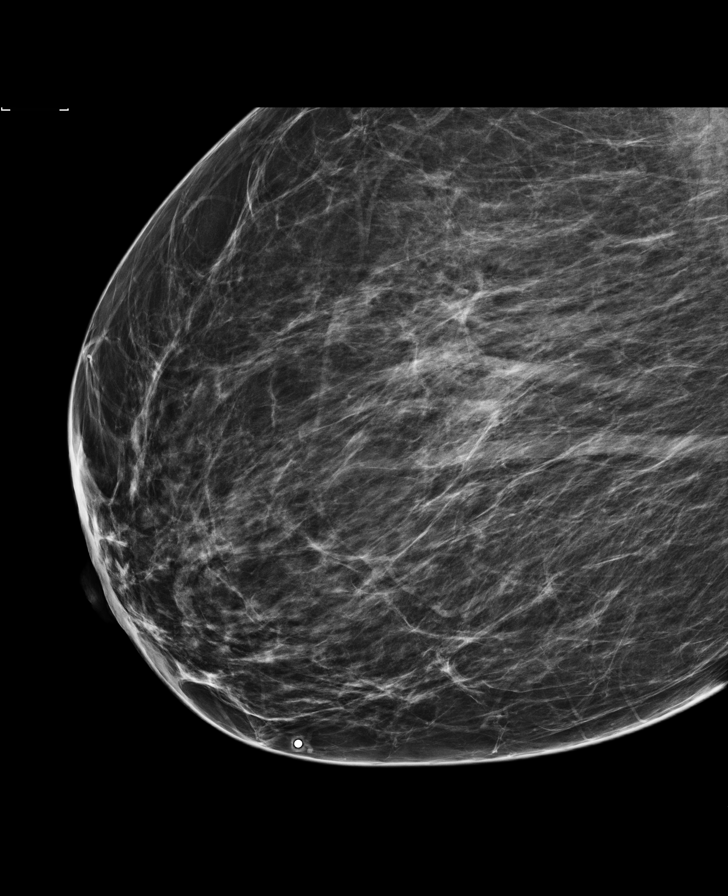

[R MLO (2 of 3)]
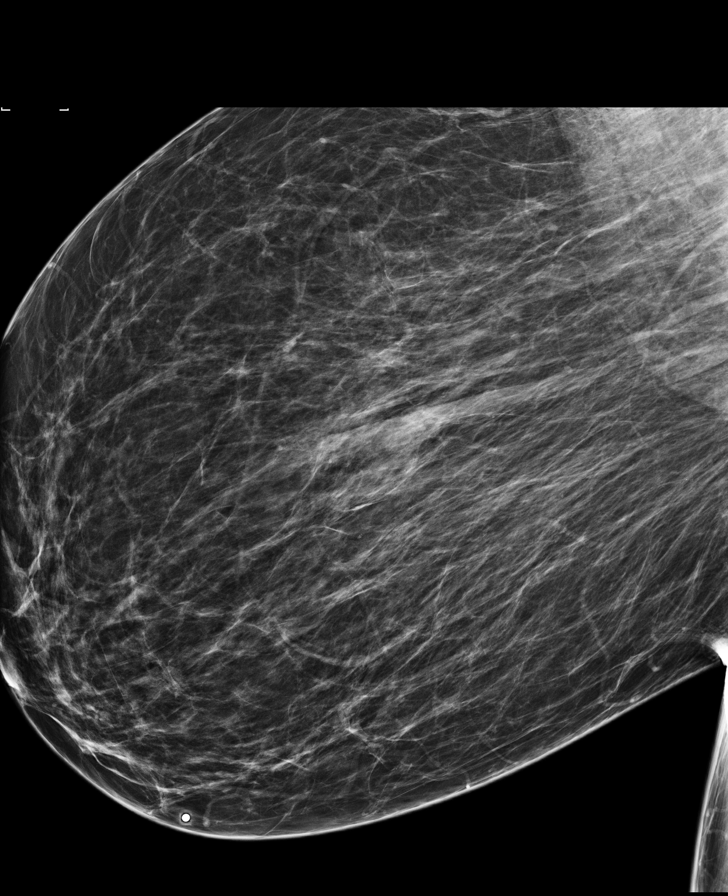

[R MLO synth-2D (1 of 2)]
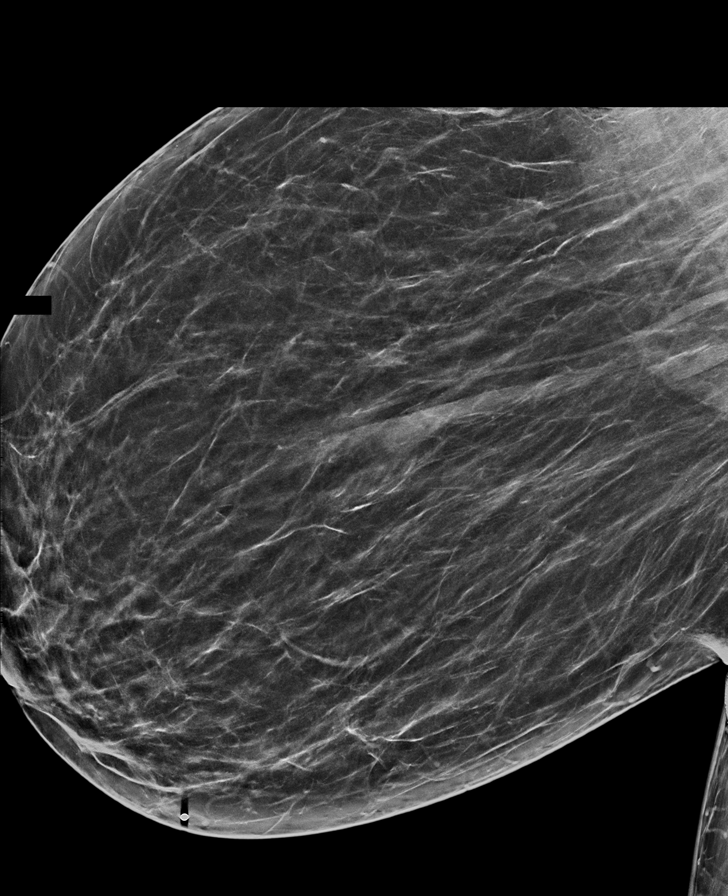

[R MLO (3 of 3)]
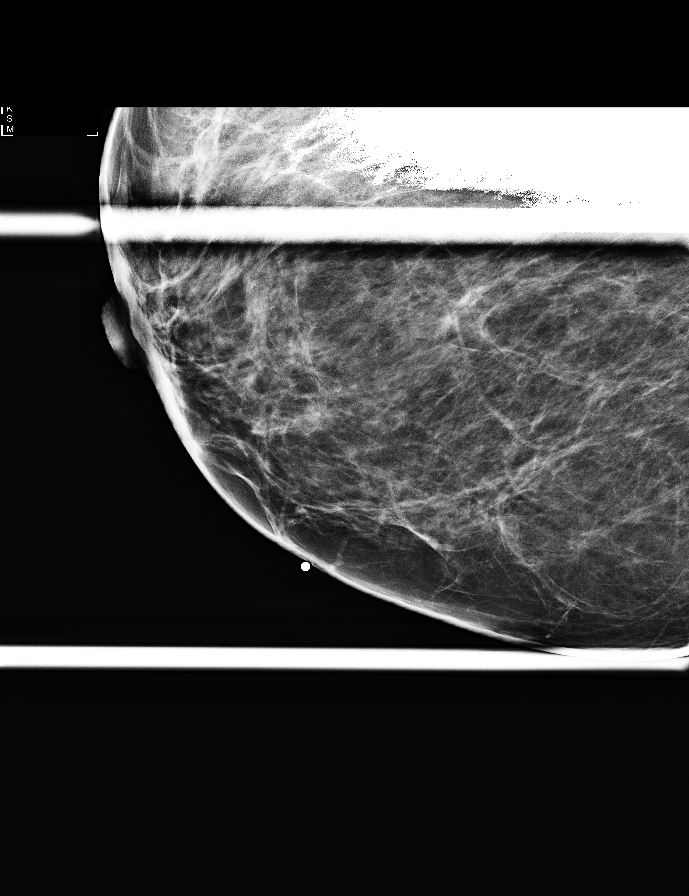

[R CC]
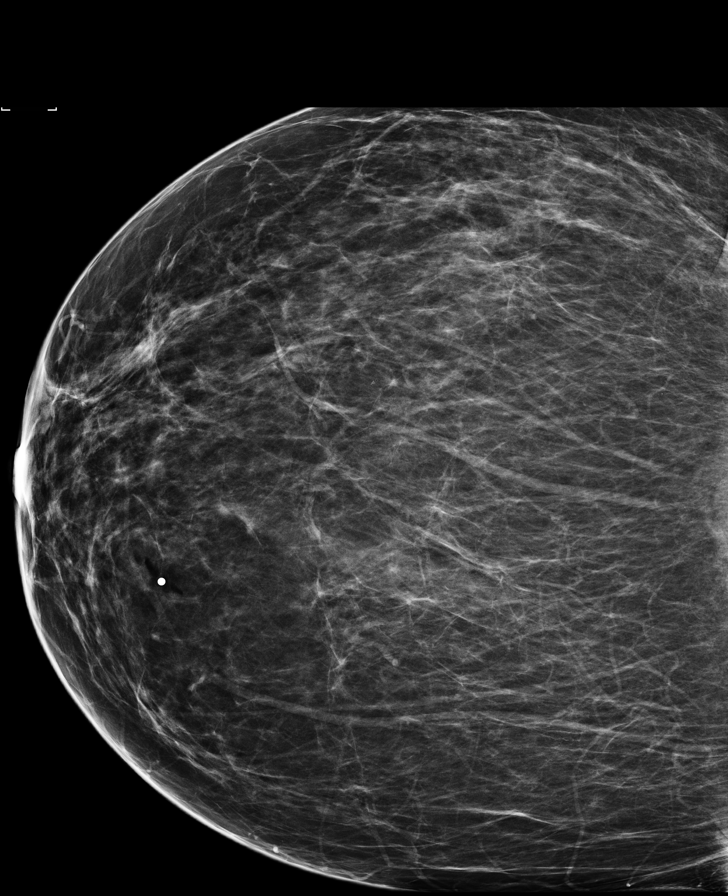

[R CC synth-2D]
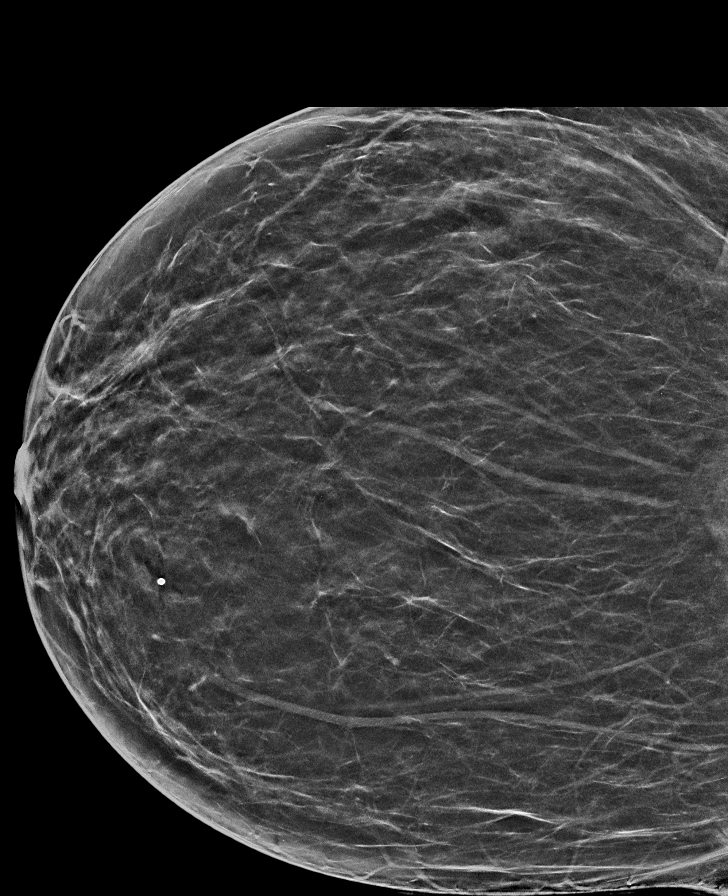

[R MLO synth-2D (2 of 2)]
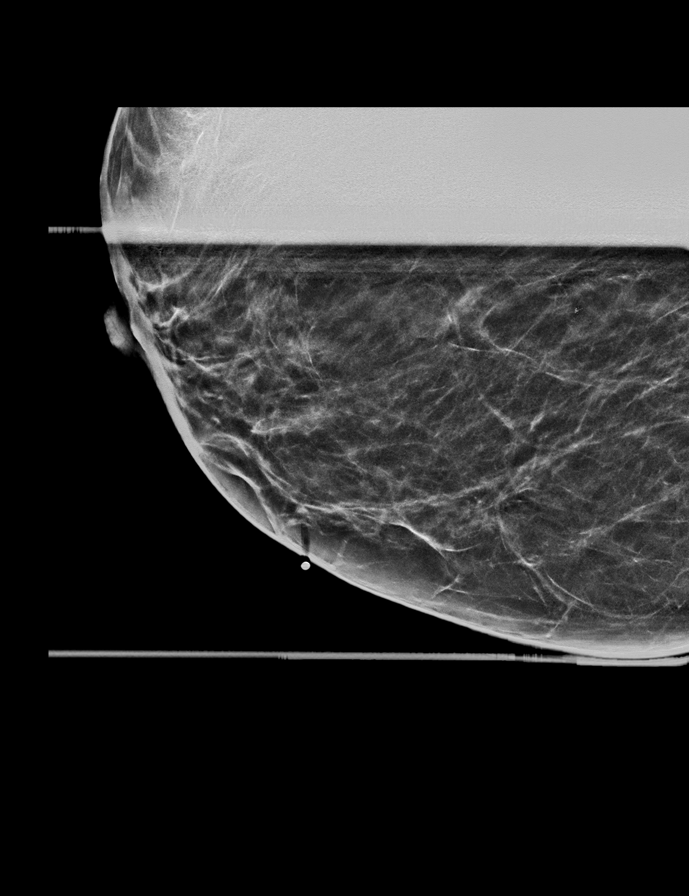

[R MLO tomo · tomo slice 27/53.0]
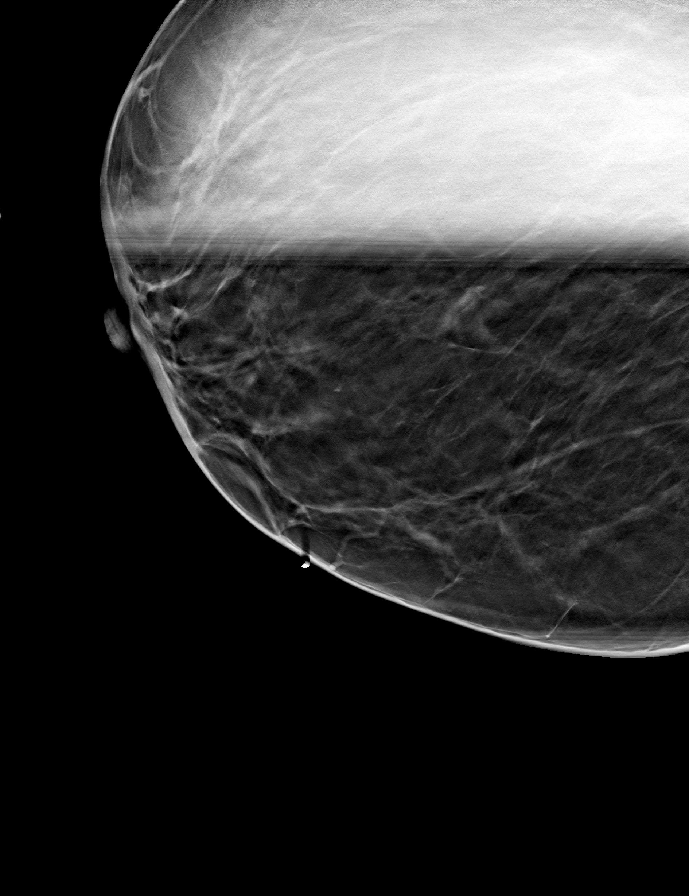

[8 of 22 positions shown; findings below may reference images not displayed]

ACR Breast Density Category b: There are scattered areas of
fibroglandular density.
FINDINGS: 2D and 3D full field and spot compression views of the right breast
demonstrate no suspicious mass, distortion or worrisome
calcifications.

Mammographic images were processed with CAD.

On physical exam, no suspicious palpable abnormalities are
identified in the inner lower right breast, in the area of patient
concern.

Targeted ultrasound is performed, showing no solid or cystic mass,
distortion or abnormal shadowing within the inner lower right
breast, in the area of patient concern.
IMPRESSION: No mammographic, palpable or sonographic abnormality within the
right breast, in the area of patient concern.

No mammographic evidence of right breast malignancy.

RECOMMENDATION:
Bilateral screening mammograms in 1-2 months to resume annual
mammogram schedule.

I have discussed the findings and recommendations with the patient.
Results were also provided in writing at the conclusion of the
visit. If applicable, a reminder letter will be sent to the patient
regarding the next appointment.

BI-RADS CATEGORY  1: Negative.

## 2017-07-30 DIAGNOSIS — J029 Acute pharyngitis, unspecified: Secondary | ICD-10-CM | POA: Diagnosis not present

## 2017-07-30 DIAGNOSIS — J069 Acute upper respiratory infection, unspecified: Secondary | ICD-10-CM | POA: Diagnosis not present

## 2017-08-03 DIAGNOSIS — E559 Vitamin D deficiency, unspecified: Secondary | ICD-10-CM | POA: Diagnosis not present

## 2017-08-03 DIAGNOSIS — Z0000 Encounter for general adult medical examination without abnormal findings: Secondary | ICD-10-CM | POA: Diagnosis not present

## 2017-08-03 DIAGNOSIS — Z23 Encounter for immunization: Secondary | ICD-10-CM | POA: Diagnosis not present

## 2017-08-03 DIAGNOSIS — Z131 Encounter for screening for diabetes mellitus: Secondary | ICD-10-CM | POA: Diagnosis not present

## 2017-08-03 DIAGNOSIS — Z136 Encounter for screening for cardiovascular disorders: Secondary | ICD-10-CM | POA: Diagnosis not present

## 2017-08-03 DIAGNOSIS — D509 Iron deficiency anemia, unspecified: Secondary | ICD-10-CM | POA: Diagnosis not present

## 2017-08-07 DIAGNOSIS — B954 Other streptococcus as the cause of diseases classified elsewhere: Secondary | ICD-10-CM | POA: Diagnosis not present

## 2017-08-07 DIAGNOSIS — N390 Urinary tract infection, site not specified: Secondary | ICD-10-CM | POA: Diagnosis not present

## 2017-08-07 DIAGNOSIS — R8271 Bacteriuria: Secondary | ICD-10-CM | POA: Diagnosis not present

## 2017-08-07 DIAGNOSIS — N3020 Other chronic cystitis without hematuria: Secondary | ICD-10-CM | POA: Diagnosis not present

## 2017-08-21 DIAGNOSIS — N3020 Other chronic cystitis without hematuria: Secondary | ICD-10-CM | POA: Diagnosis not present

## 2017-08-21 DIAGNOSIS — N139 Obstructive and reflux uropathy, unspecified: Secondary | ICD-10-CM | POA: Diagnosis not present

## 2017-09-03 DIAGNOSIS — G479 Sleep disorder, unspecified: Secondary | ICD-10-CM | POA: Diagnosis not present

## 2017-10-08 DIAGNOSIS — N3020 Other chronic cystitis without hematuria: Secondary | ICD-10-CM | POA: Diagnosis not present

## 2017-10-08 DIAGNOSIS — N139 Obstructive and reflux uropathy, unspecified: Secondary | ICD-10-CM | POA: Diagnosis not present

## 2017-10-13 DIAGNOSIS — E559 Vitamin D deficiency, unspecified: Secondary | ICD-10-CM | POA: Diagnosis not present

## 2017-11-19 DIAGNOSIS — N3020 Other chronic cystitis without hematuria: Secondary | ICD-10-CM | POA: Diagnosis not present

## 2017-12-28 DIAGNOSIS — L509 Urticaria, unspecified: Secondary | ICD-10-CM | POA: Diagnosis not present

## 2018-02-15 DIAGNOSIS — J069 Acute upper respiratory infection, unspecified: Secondary | ICD-10-CM | POA: Diagnosis not present

## 2019-03-08 ENCOUNTER — Encounter (HOSPITAL_BASED_OUTPATIENT_CLINIC_OR_DEPARTMENT_OTHER): Payer: Self-pay | Admitting: *Deleted

## 2019-03-08 ENCOUNTER — Other Ambulatory Visit: Payer: Self-pay

## 2019-03-08 ENCOUNTER — Emergency Department (HOSPITAL_BASED_OUTPATIENT_CLINIC_OR_DEPARTMENT_OTHER)
Admission: EM | Admit: 2019-03-08 | Discharge: 2019-03-08 | Disposition: A | Discharge status: Home / Self Care | Payer: BC Managed Care – PPO | Attending: Emergency Medicine | Admitting: Emergency Medicine

## 2019-03-08 DIAGNOSIS — Y999 Unspecified external cause status: Secondary | ICD-10-CM | POA: Insufficient documentation

## 2019-03-08 DIAGNOSIS — Y93G1 Activity, food preparation and clean up: Secondary | ICD-10-CM | POA: Insufficient documentation

## 2019-03-08 DIAGNOSIS — Y929 Unspecified place or not applicable: Secondary | ICD-10-CM | POA: Diagnosis not present

## 2019-03-08 DIAGNOSIS — W272XXA Contact with scissors, initial encounter: Secondary | ICD-10-CM | POA: Diagnosis not present

## 2019-03-08 DIAGNOSIS — Z23 Encounter for immunization: Secondary | ICD-10-CM | POA: Insufficient documentation

## 2019-03-08 DIAGNOSIS — S61211A Laceration without foreign body of left index finger without damage to nail, initial encounter: Secondary | ICD-10-CM | POA: Diagnosis not present

## 2019-03-08 MED ORDER — "THROMBI-PAD 3""X3"" EX PADS"
MEDICATED_PAD | CUTANEOUS | Status: AC
  Administered 2019-03-08
  Filled 2019-03-08: qty 1

## 2019-03-08 MED ORDER — TETANUS-DIPHTH-ACELL PERTUSSIS 5-2.5-18.5 LF-MCG/0.5 IM SUSP
0.5000 mL | Freq: Once | INTRAMUSCULAR | Status: AC
  Administered 2019-03-08: 23:00:00 0.5 mL via INTRAMUSCULAR
  Filled 2019-03-08: qty 0.5

## 2019-03-08 MED ORDER — IBUPROFEN 800 MG PO TABS: 800.0000 mg | ORAL_TABLET | Freq: Three times a day (TID) | ORAL | 0 refills | Status: DC

## 2019-03-08 MED ORDER — ACETAMINOPHEN 500 MG PO TABS: 500.0000 mg | ORAL_TABLET | Freq: Four times a day (QID) | ORAL | 0 refills | Status: AC | PRN

## 2019-03-08 MED ORDER — IBUPROFEN 800 MG PO TABS
800.0000 mg | ORAL_TABLET | Freq: Once | ORAL | Status: AC
  Administered 2019-03-08: 800 mg via ORAL
  Filled 2019-03-08: qty 1

## 2019-03-08 MED ORDER — TETANUS-DIPHTH-ACELL PERTUSSIS 5-2.5-18.5 LF-MCG/0.5 IM SUSP
INTRAMUSCULAR | Status: AC
  Filled 2019-03-08: qty 0.5

## 2019-03-08 MED ORDER — IBUPROFEN 800 MG PO TABS
ORAL_TABLET | ORAL | Status: AC
  Filled 2019-03-08: qty 1

## 2019-03-08 NOTE — ED Notes (Signed)
Pt. Has clipped the tip end of the L ring finger the meaty part.  Bleeding noted when old bandage taken off.

## 2019-03-08 NOTE — ED Triage Notes (Addendum)
Pt reports that she cut the tip of her left index finger with a pair of kitchen shears tonight.  Bleeding controlled in triage

## 2019-03-08 NOTE — Discharge Instructions (Addendum)
Please review the attached instructions on wound care.  Take the ibuprofen 800 mg, as prescribed.  Also take the Tylenol as needed for additional pain relief.  Return to the ED or seek medical attention should she develop any purulent discharge, spreading redness, significant swelling, fevers, chills, or any other new or worsening symptoms.

## 2019-03-08 NOTE — ED Notes (Signed)
Cleaned wound with Saline and placed nonstick bandage on wound till to be seen by EDP.

## 2019-03-08 NOTE — ED Provider Notes (Signed)
[facility] EMERGENCY DEPARTMENT Provider Note   CSN: [id number] Arrival date & time: 03/08/19  2003     History   Chief Complaint Chief Complaint  Patient presents with  . Extremity Laceration    HPI Katie Curry is a 48 y.o. female with no relevant past medical history presents to the ED accompanied by her husband after cutting the tip of her left index finger off with a pair of scissors while preparing dinner.  The wound has been extensively cleaned and hemostasis is relatively achieved with compression wrap.  It is on her nondominant hand.  She is unsure of her tetanus status.  She complains of 7 out of 10 pain right now and is requesting analgesics as she has not yet taken anything.  She was feeling perfectly fine prior to the accident and denies any and all other symptoms.     HPI  Past Medical History:  Diagnosis Date  . Anxiety    no meds    There are no active problems to display for this patient.   Past Surgical History:  Procedure Laterality Date  . BLADDER SUSPENSION  05/14/2011   Procedure: TRANSVAGINAL TAPE (TVT) PROCEDURE;  Surgeon: Daria Pastures;  Location: [facility];  Service: Gynecology;  Laterality: N/A;  . CYSTOCELE REPAIR  05/14/2011   Procedure: ANTERIOR REPAIR (CYSTOCELE);  Surgeon: Daria Pastures;  Location: [facility];  Service: Gynecology;  Laterality: N/A;  . CYSTOSCOPY  05/14/2011   Procedure: CYSTOSCOPY;  Surgeon: Daria Pastures;  Location: [facility];  Service: Gynecology;  Laterality: N/A;  . DILATION AND CURETTAGE OF UTERUS  2001   Missed AB  . LAPAROSCOPIC GASTRIC SLEEVE RESECTION    . LIPOMA EXCISION  2006  . VAGINAL HYSTERECTOMY  05/14/2011   Procedure: HYSTERECTOMY VAGINAL;  Surgeon: Daria Pastures;  Location: [facility];  Service: Gynecology;  Laterality: N/A;     OB History   No obstetric history on file.      Home Medications    Prior to Admission medications   Medication Sig Start Date End Date  Taking? Authorizing Provider  acetaminophen (TYLENOL) 500 MG tablet Take 1 tablet (500 mg total) by mouth every 6 (six) hours as needed. 03/08/19   Corena Herter, PA-C  Fesoterodine Fumarate (TOVIAZ) 8 MG TB24 Take 8 mg by mouth daily.      [provider]  ibuprofen (ADVIL) 800 MG tablet Take 1 tablet (800 mg total) by mouth 3 (three) times daily. 03/08/19   Corena Herter, PA-C  SUMAtriptan (IMITREX) 100 MG tablet Take 100 mg by mouth every 2 (two) hours as needed. For migraines     [provider]    Family History History reviewed. No pertinent family history.  Social History Social History   Tobacco Use  . Smoking status: Never Smoker  Substance Use Topics  . Alcohol use: Yes    Comment: occasionaly  . Drug use: No     Allergies   Azithromycin, Food, and Sulfa antibiotics   Review of Systems Review of Systems  All other systems reviewed and are negative.    Physical Exam Updated Vital Signs BP 114/85 (BP Location: Right Arm)   Pulse 76   Temp 98.2 F (36.8 C) (Oral)   Resp 18   Ht 5\' 7"  (1.702 m)   Wt 93 kg   SpO2 100%   BMI 32.11 kg/m   Physical Exam Vitals signs and nursing note reviewed. Exam conducted with a  chaperone present.  Constitutional:      Appearance: Normal appearance.  HENT:     Head: Normocephalic and atraumatic.  Eyes:     General: No scleral icterus.    Conjunctiva/sclera: Conjunctivae normal.  Pulmonary:     Effort: Pulmonary effort is normal.  Musculoskeletal:     Comments: 2 x 1 cm gaping wound on palmar aspect tip of left ring finger that cannot be approximate. Continuing to ooze blood after removing temporary wrap. No evidence of bony involvement. Entire depth of wound visualized. No surrounding erythema. ROM, strength, and sensation of proximal joints intact.   Skin:    General: Skin is dry.  Neurological:     Mental Status: She is alert.     GCS: GCS eye subscore is 4. GCS verbal subscore is 5. GCS motor  subscore is 6.  Psychiatric:        Mood and Affect: Mood normal.        Behavior: Behavior normal.        Thought Content: Thought content normal.      ED Treatments / Results  Labs (all labs ordered are listed, but only abnormal results are displayed) Labs Reviewed - No data to display  EKG None  Radiology No results found.  Procedures Procedures (including critical care time)  Medications Ordered in ED Medications  ibuprofen (ADVIL) tablet 800 mg (800 mg Oral Given 03/08/19 2248)  Tdap (BOOSTRIX) injection 0.5 mL (0.5 mLs Intramuscular Given 03/08/19 2248)     Initial Impression / Assessment and Plan / ED Course  I have reviewed the triage vital signs and the nursing notes.  Pertinent labs & imaging results that were available during my care of the patient were reviewed by me and considered in my medical decision making (see chart for details).       Patient had snipped a 2 x 1 cm piece of tissue from the palmar aspect of distal tip of left ring finger that could not be approximated and repaired with suture or skin glue.  Instead, needed to dress wound with quick clot and allow for pressure induced hemostasis.  Provide patient with Boostrix injection given her unknown tetanus status.  Will prescribe ibuprofen 800 mg 3 times daily for ongoing pain and discomfort.  She can supplement her pain medication with Tylenol, as needed.  Provide patient with wound care instructions.  Instructed to return to ED or seek medical attention should she develop any spreading redness, significant swelling, fevers, chills, purulence, or any other new or worsening symptoms.  Final Clinical Impressions(s) / ED Diagnoses   Final diagnoses:  Laceration of left index finger without foreign body without damage to nail, initial encounter    ED Discharge Orders         Ordered    acetaminophen (TYLENOL) 500 MG tablet  Every 6 hours PRN     03/08/19 2309    ibuprofen (ADVIL) 800 MG tablet  3  times daily     03/08/19 2310           Reita Chard 03/08/19 2311    Dorie Rank, MD 03/09/19 1133

## 2019-03-08 NOTE — ED Notes (Signed)
Pt. Has a laceration to the tip of the L ring finger that was cut with scissors at approx. 1930 tonight.  Controlled bleeding noted.

## 2019-09-28 ENCOUNTER — Emergency Department (HOSPITAL_BASED_OUTPATIENT_CLINIC_OR_DEPARTMENT_OTHER)
Admission: EM | Admit: 2019-09-28 | Discharge: 2019-09-28 | Disposition: A | Discharge status: Home / Self Care | Payer: BLUE CROSS/BLUE SHIELD | Attending: Emergency Medicine | Admitting: Emergency Medicine

## 2019-09-28 ENCOUNTER — Emergency Department (HOSPITAL_BASED_OUTPATIENT_CLINIC_OR_DEPARTMENT_OTHER): Payer: BLUE CROSS/BLUE SHIELD

## 2019-09-28 ENCOUNTER — Other Ambulatory Visit: Payer: Self-pay

## 2019-09-28 ENCOUNTER — Encounter (HOSPITAL_BASED_OUTPATIENT_CLINIC_OR_DEPARTMENT_OTHER): Payer: Self-pay | Admitting: *Deleted

## 2019-09-28 DIAGNOSIS — Z881 Allergy status to other antibiotic agents status: Secondary | ICD-10-CM | POA: Diagnosis not present

## 2019-09-28 DIAGNOSIS — U071 COVID-19: Secondary | ICD-10-CM | POA: Diagnosis not present

## 2019-09-28 DIAGNOSIS — R0602 Shortness of breath: Secondary | ICD-10-CM | POA: Diagnosis present

## 2019-09-28 DIAGNOSIS — Z882 Allergy status to sulfonamides status: Secondary | ICD-10-CM | POA: Diagnosis not present

## 2019-09-28 HISTORY — DX: COVID-19: U07.1

## 2019-09-28 MED ORDER — ONDANSETRON 4 MG PO TBDP: 4.0000 mg | ORAL_TABLET | Freq: Three times a day (TID) | ORAL | 0 refills | Status: DC | PRN

## 2019-09-28 NOTE — ED Notes (Signed)
PT ambulated to room. Pulse ox 100%

## 2019-09-28 NOTE — ED Provider Notes (Signed)
[facility] Provider Note   CSN: [id number] Arrival date & time: 09/28/19  1833     History Chief Complaint  Patient presents with  . Shortness of Breath    Katie Curry is a 49 y.o. female.  HPI  Patient is a 49 year old female with no pertinent past medical history apart from recent cover diagnosis on Thursday.  She states that her children also tested positive.  She states that she has had some chest tightness shortness of breath for several days that worsened today.  She states she feels that when she takes deep breaths she has to cough.  She denies any wheezing.  Denies any overt chest pain.  Patient denies any hemoptysis, chest pain, nausea, vomiting.  She states she has occasional issues with some congestion since when she was first diagnosed with Covid.  She also endorses some decreased appetite.  She states that she is been eating and drinking less.  States she has decreased sense of smell and taste.  She denies fevers or chills.     Past Medical History:  Diagnosis Date  . Anxiety    no meds  . COVID-19     There are no problems to display for this patient.   Past Surgical History:  Procedure Laterality Date  . BLADDER SUSPENSION  05/14/2011   Procedure: TRANSVAGINAL TAPE (TVT) PROCEDURE;  Surgeon: Daria Pastures;  Location: [facility];  Service: Gynecology;  Laterality: N/A;  . CYSTOCELE REPAIR  05/14/2011   Procedure: ANTERIOR REPAIR (CYSTOCELE);  Surgeon: Daria Pastures;  Location: [facility];  Service: Gynecology;  Laterality: N/A;  . CYSTOSCOPY  05/14/2011   Procedure: CYSTOSCOPY;  Surgeon: Daria Pastures;  Location: [facility];  Service: Gynecology;  Laterality: N/A;  . DILATION AND CURETTAGE OF UTERUS  2001   Missed AB  . LAPAROSCOPIC GASTRIC SLEEVE RESECTION    . LIPOMA EXCISION  2006  . VAGINAL HYSTERECTOMY  05/14/2011   Procedure: HYSTERECTOMY VAGINAL;  Surgeon: Daria Pastures;  Location: [facility];   Service: Gynecology;  Laterality: N/A;     OB History   No obstetric history on file.     No family history on file.  Social History   Tobacco Use  . Smoking status: Never Smoker  Substance Use Topics  . Alcohol use: Yes    Comment: occasionaly  . Drug use: No    Home Medications Prior to Admission medications   Medication Sig Start Date End Date Taking? Authorizing Provider  acetaminophen (TYLENOL) 500 MG tablet Take 1 tablet (500 mg total) by mouth every 6 (six) hours as needed. 03/08/19   Corena Herter, PA-C  Fesoterodine Fumarate (TOVIAZ) 8 MG TB24 Take 8 mg by mouth daily.      [provider]  ibuprofen (ADVIL) 800 MG tablet Take 1 tablet (800 mg total) by mouth 3 (three) times daily. 03/08/19   Corena Herter, PA-C  ondansetron (ZOFRAN ODT) 4 MG disintegrating tablet Take 1 tablet (4 mg total) by mouth every 8 (eight) hours as needed for nausea or vomiting. 09/28/19   Tedd Sias, PA  SUMAtriptan (IMITREX) 100 MG tablet Take 100 mg by mouth every 2 (two) hours as needed. For migraines     [provider]    Allergies    Azithromycin, Food, and Sulfa antibiotics  Review of Systems   Review of Systems  Constitutional: Positive for fatigue. Negative for chills and fever.  HENT: Positive for congestion.  Respiratory: Positive for cough and shortness of breath.   Cardiovascular: Negative for chest pain.  Gastrointestinal: Negative for abdominal distention.  Musculoskeletal: Positive for myalgias.  Neurological: Negative for dizziness and headaches.    Physical Exam Updated Vital Signs BP (!) 133/92 (BP Location: Right Arm)   Pulse 85   Temp 98.8 F (37.1 C) (Oral)   Resp 16   Ht 5\' 7"  (1.702 m)   Wt 95.3 kg   SpO2 99%   BMI 32.89 kg/m   Physical Exam Vitals and nursing note reviewed.  Constitutional:      General: She is not in acute distress.    Comments: Patient is pleasant 49 year old female in no acute distress.  HENT:      Head: Normocephalic and atraumatic.     Nose: Nose normal.  Eyes:     General: No scleral icterus. Cardiovascular:     Rate and Rhythm: Normal rate and regular rhythm.     Pulses: Normal pulses.     Heart sounds: Normal heart sounds.  Pulmonary:     Effort: Pulmonary effort is normal. No respiratory distress.     Breath sounds: No wheezing.     Comments: Patient speaking in full sentence without difficulty.  No tachypnea or hypoxia Abdominal:     Palpations: Abdomen is soft.     Tenderness: There is no abdominal tenderness.  Musculoskeletal:     Cervical back: Normal range of motion.     Right lower leg: No edema.     Left lower leg: No edema.  Skin:    General: Skin is warm and dry.     Capillary Refill: Capillary refill takes less than 2 seconds.  Neurological:     Mental Status: She is alert. Mental status is at baseline.  Psychiatric:        Mood and Affect: Mood normal.        Behavior: Behavior normal.     ED Results / Procedures / Treatments   Labs (all labs ordered are listed, but only abnormal results are displayed) Labs Reviewed - No data to display  EKG None  Radiology DG Chest Portable 1 View  Result Date: 09/28/2019 CLINICAL DATA:  Shortness of breath for 1 day. COVID positive 09/22/2019 EXAM: PORTABLE CHEST 1 VIEW COMPARISON:  None. FINDINGS: Minor vague opacity at both lung bases. Heart is normal in size. Normal mediastinal contours. No pulmonary edema, pleural effusion or pneumothorax. No acute osseous abnormalities are seen. IMPRESSION: Minor vague bibasilar opacities, may reflect atelectasis or pneumonia in the setting of COVID-19. Electronically Signed   By: Keith Rake M.D.   On: 09/28/2019 19:18    Procedures Procedures (including critical care time)  Medications Ordered in ED Medications - No data to display  ED Course  I have reviewed the triage vital signs and the nursing notes.  Pertinent labs & imaging results that were available  during my care of the patient were reviewed by me and considered in my medical decision making (see chart for details).    MDM Rules/Calculators/A&P                      Patient is 49 year old female known to be Covid positive tested positive on Thursday.  She is started having symptoms on Tuesday.  She is presented today for evaluation because she has had some chest tightness.  And shortness of breath.  Patient is known to be Covid positive suspect that her symptoms are secondary to  this.  Doubt pulmonary embolism as patient is PERC negative.  Patient ambulated on room air without hypoxia.  Oxygen saturation 90% on room air.  Lungs are clear to auscultation bilaterally.  I dependently reviewed patient's chest x-ray she does have evidence of Covid pneumonia.  Doubt superinfection of bacterial origin given that she is afebrile well-appearing.  Discharged with conservative therapy and strict return precautions.  She is aware of plan.  She is agreeable to plan.  She will return to ED if she has any new concerning symptoms.  Patient discharged with Zofran for nausea in case this becomes an issue.  Final Clinical Impression(s) / ED Diagnoses Final diagnoses:  COVID-19   Katie Curry was evaluated in Emergency Department on 09/28/2019 for the symptoms described in the history of present illness. She was evaluated in the context of the global COVID-19 pandemic, which necessitated consideration that the patient might be at risk for infection with the SARS-CoV-2 virus that causes COVID-19. Institutional protocols and algorithms that pertain to the evaluation of patients at risk for COVID-19 are in a state of rapid change based on information released by regulatory bodies including the CDC and federal and state organizations. These policies and algorithms were followed during the patient's care in the ED.   Rx / DC Orders ED Discharge Orders         Ordered    ondansetron (ZOFRAN ODT) 4 MG  disintegrating tablet  Every 8 hours PRN     09/28/19 2243           Tedd Sias, [place] 09/28/19 2248    Fredia Sorrow, MD 10/01/19 [phone]

## 2019-09-28 NOTE — Discharge Instructions (Addendum)
Follow CDC guidelines and quarantine, wear a mask, wash hands often.   Please take over the counter vitamin D 2000-4000 units per day. I also recommend zinc 50 mg per day for the next two weeks.   Please return to ED if you feel have difficulty breathing or have emergent, new or concerning symptoms.  Patients who have symptoms consistent with COVID-19 should self isolated for: At least 3 days (72 hours) have passed since recovery, defined as resolution of fever without the use of fever reducing medications and improvement in respiratory symptoms (e.g., cough, shortness of breath), and At least 7 days have passed since symptoms first appeared.       Person Under Monitoring Name: Katie Curry  Location: [place] [address]   Infection Prevention Recommendations for Individuals Confirmed to have, or Being Evaluated for, 2019 Novel Coronavirus (COVID-19) Infection Who Receive Care at Home  Individuals who are confirmed to have, or are being evaluated for, COVID-19 should follow the prevention steps below until a healthcare provider or local or state health department says they can return to normal activities.  Stay home except to get medical care You should restrict activities outside your home, except for getting medical care. Do not go to work, school, or public areas, and do not use public transportation or taxis.  Call ahead before visiting your doctor Before your medical appointment, call the healthcare provider and tell them that you have, or are being evaluated for, COVID-19 infection. This will help the healthcare provider's office take steps to keep other people from getting infected. Ask your healthcare provider to call the local or state health department.  Monitor your symptoms Seek prompt medical attention if your illness is worsening (e.g., difficulty breathing). Before going to your medical appointment, call the healthcare provider and  tell them that you have, or are being evaluated for, COVID-19 infection. Ask your healthcare provider to call the local or state health department.  Wear a facemask You should wear a facemask that covers your nose and mouth when you are in the same room with other people and when you visit a healthcare provider. People who live with or visit you should also wear a facemask while they are in the same room with you.  Separate yourself from other people in your home As much as possible, you should stay in a different room from other people in your home. Also, you should use a separate bathroom, if available.  Avoid sharing household items You should not share dishes, drinking glasses, cups, eating utensils, towels, bedding, or other items with other people in your home. After using these items, you should wash them thoroughly with soap and water.  Cover your coughs and sneezes Cover your mouth and nose with a tissue when you cough or sneeze, or you can cough or sneeze into your sleeve. Throw used tissues in a lined trash can, and immediately wash your hands with soap and water for at least 20 seconds or use an alcohol-based hand rub.  Wash your [employer] [employer] your hands often and thoroughly with soap and water for at least 20 seconds. You can use an alcohol-based hand sanitizer if soap and water are not available and if your hands are not visibly dirty. Avoid touching your eyes, nose, and mouth with unwashed hands.   Prevention Steps for Caregivers and Household Members of Individuals Confirmed to have, or Being Evaluated for, COVID-19 Infection Being Cared for in the Home  If you  live with, or provide care at home for, a person confirmed to have, or being evaluated for, COVID-19 infection please follow these guidelines to prevent infection:  Follow healthcare provider's instructions Make sure that you understand and can help the patient follow any healthcare provider instructions for all  care.  Provide for the patient's basic needs You should help the patient with basic needs in the home and provide support for getting groceries, prescriptions, and other personal needs.  Monitor the patient's symptoms If they are getting sicker, call his or her medical provider and tell them that the patient has, or is being evaluated for, COVID-19 infection. This will help the healthcare provider's office take steps to keep other people from getting infected. Ask the healthcare provider to call the local or state health department.  Limit the number of people who have contact with the patient If possible, have only one caregiver for the patient. Other household members should stay in another home or place of residence. If this is not possible, they should stay in another room, or be separated from the patient as much as possible. Use a separate bathroom, if available. Restrict visitors who do not have an essential need to be in the home.  Keep older adults, very young children, and other sick people away from the patient Keep older adults, very young children, and those who have compromised immune systems or chronic health conditions away from the patient. This includes people with chronic heart, lung, or kidney conditions, diabetes, and cancer.  Ensure good ventilation Make sure that shared spaces in the home have good air flow, such as from an air conditioner or an opened window, weather permitting.  Wash your hands often Wash your hands often and thoroughly with soap and water for at least 20 seconds. You can use an alcohol based hand sanitizer if soap and water are not available and if your hands are not visibly dirty. Avoid touching your eyes, nose, and mouth with unwashed hands. Use disposable paper towels to dry your hands. If not available, use dedicated cloth towels and replace them when they become wet.  Wear a facemask and gloves Wear a disposable facemask at all times in  the room and gloves when you touch or have contact with the patient's blood, body fluids, and/or secretions or excretions, such as sweat, saliva, sputum, nasal mucus, vomit, urine, or feces.  Ensure the mask fits over your nose and mouth tightly, and do not touch it during use. Throw out disposable facemasks and gloves after using them. Do not reuse. Wash your hands immediately after removing your facemask and gloves. If your personal clothing becomes contaminated, carefully remove clothing and launder. Wash your hands after handling contaminated clothing. Place all used disposable facemasks, gloves, and other waste in a lined container before disposing them with other household waste. Remove gloves and wash your hands immediately after handling these items.  Do not share dishes, glasses, or other household items with the patient Avoid sharing household items. You should not share dishes, drinking glasses, cups, eating utensils, towels, bedding, or other items with a patient who is confirmed to have, or being evaluated for, COVID-19 infection. After the person uses these items, you should wash them thoroughly with soap and water.  Wash laundry thoroughly Immediately remove and wash clothes or bedding that have blood, body fluids, and/or secretions or excretions, such as sweat, saliva, sputum, nasal mucus, vomit, urine, or feces, on them. Wear gloves when handling laundry from the patient.  Read and follow directions on labels of laundry or clothing items and detergent. In general, wash and dry with the warmest temperatures recommended on the label.  Clean all areas the individual has used often Clean all touchable surfaces, such as counters, tabletops, doorknobs, bathroom fixtures, toilets, phones, keyboards, tablets, and bedside tables, every day. Also, clean any surfaces that may have blood, body fluids, and/or secretions or excretions on them. Wear gloves when cleaning surfaces the patient has  come in contact with. Use a diluted bleach solution (e.g., dilute bleach with 1 part bleach and 10 parts water) or a household disinfectant with a label that says EPA-registered for coronaviruses. To make a bleach solution at home, add 1 tablespoon of bleach to 1 quart (4 cups) of water. For a larger supply, add  cup of bleach to 1 gallon (16 cups) of water. Read labels of cleaning products and follow recommendations provided on product labels. Labels contain instructions for safe and effective use of the cleaning product including precautions you should take when applying the product, such as wearing gloves or eye protection and making sure you have good ventilation during use of the product. Remove gloves and wash hands immediately after cleaning.  Monitor yourself for signs and symptoms of illness Caregivers and household members are considered close contacts, should monitor their health, and will be asked to limit movement outside of the home to the extent possible. Follow the monitoring steps for close contacts listed on the symptom monitoring form.   ? If you have additional questions, contact your local health department or call the epidemiologist on call at [phone] (available 24/7). ? This guidance is subject to change. For the most up-to-date guidance from [facility], please refer to their website: YouBlogs.pl

## 2019-09-28 NOTE — ED Triage Notes (Signed)
Pt c/o SOB x 1 day , COvid positive

## 2021-02-17 IMAGING — DX DG CHEST 1V PORT
1 series · 1 of 1 positions shown · non-contrast
Comparison: None.

CLINICAL DATA: Shortness of breath for 1 day. COVID positive
09/22/2019

EXAM:
PORTABLE CHEST 1 VIEW

[chest ap]
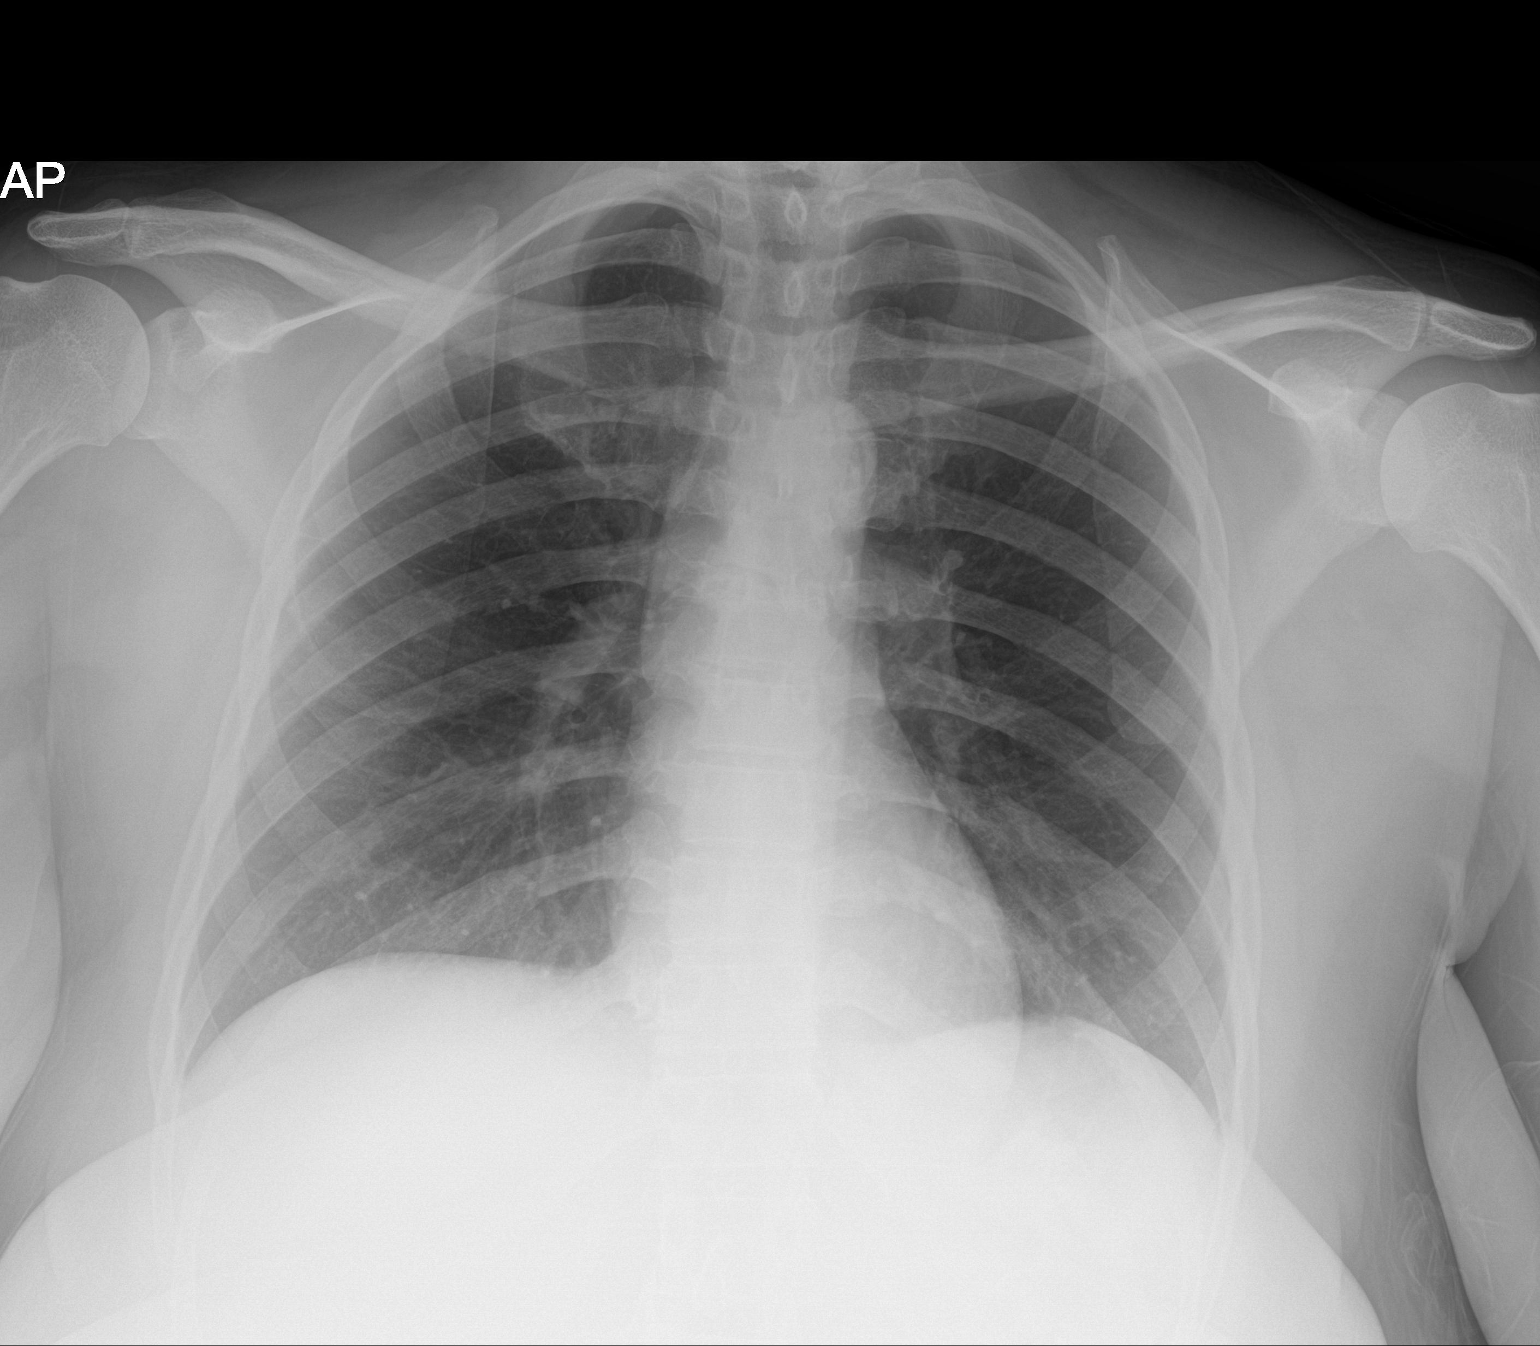

[1 of 1 positions shown; findings below may reference images not displayed]

FINDINGS: Minor vague opacity at both lung bases. Heart is normal in size.
Normal mediastinal contours. No pulmonary edema, pleural effusion or
pneumothorax. No acute osseous abnormalities are seen.
IMPRESSION: Minor vague bibasilar opacities, may reflect atelectasis or
pneumonia in the setting of [id number].

## 2021-03-28 ENCOUNTER — Encounter (HOSPITAL_BASED_OUTPATIENT_CLINIC_OR_DEPARTMENT_OTHER): Payer: Self-pay

## 2021-03-28 ENCOUNTER — Emergency Department (HOSPITAL_BASED_OUTPATIENT_CLINIC_OR_DEPARTMENT_OTHER)
Admission: EM | Admit: 2021-03-28 | Discharge: 2021-03-28 | Disposition: A | Discharge status: Home / Self Care | Payer: BC Managed Care – PPO | Attending: Emergency Medicine | Admitting: Emergency Medicine

## 2021-03-28 ENCOUNTER — Other Ambulatory Visit: Payer: Self-pay

## 2021-03-28 DIAGNOSIS — G44209 Tension-type headache, unspecified, not intractable: Secondary | ICD-10-CM

## 2021-03-28 DIAGNOSIS — R519 Headache, unspecified: Secondary | ICD-10-CM | POA: Diagnosis present

## 2021-03-28 DIAGNOSIS — R110 Nausea: Secondary | ICD-10-CM | POA: Insufficient documentation

## 2021-03-28 DIAGNOSIS — Z8616 Personal history of COVID-19: Secondary | ICD-10-CM | POA: Diagnosis not present

## 2021-03-28 MED ORDER — SODIUM CHLORIDE 0.9 % IV BOLUS
1000.0000 mL | Freq: Once | INTRAVENOUS | Status: AC
  Administered 2021-03-28: 1000 mL via INTRAVENOUS

## 2021-03-28 MED ORDER — METOCLOPRAMIDE HCL 5 MG/ML IJ SOLN
10.0000 mg | Freq: Once | INTRAMUSCULAR | Status: AC
  Administered 2021-03-28: 10 mg via INTRAVENOUS
  Filled 2021-03-28: qty 2

## 2021-03-28 MED ORDER — ACETAMINOPHEN 325 MG PO TABS
650.0000 mg | ORAL_TABLET | Freq: Once | ORAL | Status: AC
  Administered 2021-03-28: 650 mg via ORAL
  Filled 2021-03-28: qty 2

## 2021-03-28 MED ORDER — DIPHENHYDRAMINE HCL 50 MG/ML IJ SOLN
25.0000 mg | Freq: Once | INTRAMUSCULAR | Status: AC
  Administered 2021-03-28: 25 mg via INTRAVENOUS
  Filled 2021-03-28: qty 1

## 2021-03-28 NOTE — ED Notes (Signed)
Pt changing into gown

## 2021-03-28 NOTE — Discharge Instructions (Addendum)
Continue over-the-counter medications to help with your headache. Follow-up with your primary care provider. Return to the ER if you start to experience worsening headache, blurry vision, numbness in arms or legs, changes to your gait or uncontrollable vomiting

## 2021-03-28 NOTE — ED Provider Notes (Signed)
[facility] EMERGENCY DEPARTMENT Provider Note   CSN: [id number] Arrival date & time: 03/28/21  1412     History No chief complaint on file.   Katie Curry is a 50 y.o. female with a past medical history of anxiety presenting to the ED with a chief complaint of headache.  Reports intermittent headache for the past week which she attributes to stress.  She was told in the past that she had "tension headaches that only improved with migraine medicine."  She has not had a headache in about a decade.  However she states that this feels similar to her prior headaches.  Reports throbbing pain on her forehead.  Reports associated nausea.  Denies any neck stiffness, fever, rash, vomiting, blurry vision, numbness in arms or legs, head injury, changes to gait.  She last took Excedrin about 6 hours ago with only minimal improvement. No personal or family history of aneurysms.  No anticoagulant use  HPI     Past Medical History:  Diagnosis Date   Anxiety    no meds   COVID-19     There are no problems to display for this patient.   Past Surgical History:  Procedure Laterality Date   BLADDER SUSPENSION  05/14/2011   Procedure: TRANSVAGINAL TAPE (TVT) PROCEDURE;  Surgeon: Daria Pastures;  Location: [facility];  Service: Gynecology;  Laterality: N/A;   CYSTOCELE REPAIR  05/14/2011   Procedure: ANTERIOR REPAIR (CYSTOCELE);  Surgeon: Daria Pastures;  Location: [facility];  Service: Gynecology;  Laterality: N/A;   CYSTOSCOPY  05/14/2011   Procedure: CYSTOSCOPY;  Surgeon: Daria Pastures;  Location: [facility];  Service: Gynecology;  Laterality: N/A;   DILATION AND CURETTAGE OF UTERUS  2001   Missed AB   LAPAROSCOPIC GASTRIC SLEEVE RESECTION     LIPOMA EXCISION  2006   VAGINAL HYSTERECTOMY  05/14/2011   Procedure: HYSTERECTOMY VAGINAL;  Surgeon: Daria Pastures;  Location: [facility];  Service: Gynecology;  Laterality: N/A;     OB History   No obstetric history on  file.     History reviewed. No pertinent family history.  Social History   Tobacco Use   Smoking status: Never   Smokeless tobacco: Never  Vaping Use   Vaping Use: Never used  Substance Use Topics   Alcohol use: Yes    Comment: occasionaly   Drug use: No    Home Medications Prior to Admission medications   Medication Sig Start Date End Date Taking? Authorizing Provider  acetaminophen (TYLENOL) 500 MG tablet Take 1 tablet (500 mg total) by mouth every 6 (six) hours as needed. 03/08/19   Corena Herter, PA-C  Fesoterodine Fumarate (TOVIAZ) 8 MG TB24 Take 8 mg by mouth daily.      [provider]  ibuprofen (ADVIL) 800 MG tablet Take 1 tablet (800 mg total) by mouth 3 (three) times daily. 03/08/19   Corena Herter, PA-C  ondansetron (ZOFRAN ODT) 4 MG disintegrating tablet Take 1 tablet (4 mg total) by mouth every 8 (eight) hours as needed for nausea or vomiting. 09/28/19   Tedd Sias, PA  SUMAtriptan (IMITREX) 100 MG tablet Take 100 mg by mouth every 2 (two) hours as needed. For migraines     [provider]    Allergies    Azithromycin, Food, and Sulfa antibiotics  Review of Systems   Review of Systems  Constitutional:  Negative for appetite change, chills and fever.  HENT:  Negative for ear pain,  rhinorrhea, sneezing and sore throat.   Eyes:  Negative for photophobia and visual disturbance.  Respiratory:  Negative for cough, chest tightness, shortness of breath and wheezing.   Cardiovascular:  Negative for chest pain and palpitations.  Gastrointestinal:  Positive for nausea. Negative for abdominal pain, blood in stool, constipation, diarrhea and vomiting.  Genitourinary:  Negative for dysuria, hematuria and urgency.  Musculoskeletal:  Negative for myalgias.  Skin:  Negative for rash.  Neurological:  Positive for headaches. Negative for dizziness, weakness and light-headedness.   Physical Exam Updated Vital Signs BP (!) 154/120   Pulse 72   Temp  98.3 F (36.8 C) (Oral)   Resp 20   Ht 5\' 7"  (1.702 m)   Wt 90.7 kg   SpO2 100%   BMI 31.32 kg/m   Physical Exam Vitals and nursing note reviewed.  Constitutional:      General: She is not in acute distress.    Appearance: She is well-developed.  HENT:     Head: Normocephalic and atraumatic.     Nose: Nose normal.  Eyes:     General: No scleral icterus.       Left eye: No discharge.     Conjunctiva/sclera: Conjunctivae normal.  Cardiovascular:     Rate and Rhythm: Normal rate and regular rhythm.     Heart sounds: Normal heart sounds. No murmur heard.   No friction rub. No gallop.  Pulmonary:     Effort: Pulmonary effort is normal. No respiratory distress.     Breath sounds: Normal breath sounds.  Abdominal:     General: Bowel sounds are normal. There is no distension.     Palpations: Abdomen is soft.     Tenderness: There is no abdominal tenderness. There is no guarding.  Musculoskeletal:        General: Normal range of motion.     Cervical back: Normal range of motion and neck supple.  Skin:    General: Skin is warm and dry.     Findings: No rash.  Neurological:     Mental Status: She is alert and oriented to person, place, and time.     Cranial Nerves: No cranial nerve deficit.     Sensory: No sensory deficit.     Motor: No weakness or abnormal muscle tone.     Coordination: Coordination normal.     Comments: Pupils reactive. No facial asymmetry noted. Cranial nerves appear grossly intact. Sensation intact to light touch on face, BUE and BLE. Strength 5/5 in BUE and BLE.    ED Results / Procedures / Treatments   Labs (all labs ordered are listed, but only abnormal results are displayed) Labs Reviewed - No data to display  EKG None  Radiology No results found.  Procedures Procedures   Medications Ordered in ED Medications  sodium chloride 0.9 % bolus 1,000 mL (1,000 mLs Intravenous New Bag/Given 03/28/21 1754)  metoCLOPramide (REGLAN) injection 10 mg  (10 mg Intravenous Given 03/28/21 1755)  diphenhydrAMINE (BENADRYL) injection 25 mg (25 mg Intravenous Given 03/28/21 1757)  acetaminophen (TYLENOL) tablet 650 mg (650 mg Oral Given 03/28/21 1747)    ED Course  I have reviewed the triage vital signs and the nursing notes.  Pertinent labs & imaging results that were available during my care of the patient were reviewed by me and considered in my medical decision making (see chart for details).    MDM Rules/Calculators/A&P  50 year old female presenting to the ED with a chief complaint of headache.  History of tension headaches but has not had one in about a decade.  She states that this feels similar.  Reports associated nausea.  Denies any numbness in arms or legs, changes to gait or speech, blurry vision, head injury, personal family history of aneurysms.  Minimal improvement noted with Excedrin today.  This has been ongoing for about a week.  On exam patient without neurological deficits.  She is in no distress.  She has no meningeal signs.  We will treat her symptomatically and reassess.  On reassessment after medications patient symptoms have resolved.  She remains in no acute distress.  Her vital signs within normal limits.  She is requesting discharge There are no headache characteristics that are lateralizing or concerning for increased ICP, infectious or vascular cause of her symptoms.  Suspect that her symptoms are due to a tension headache.  I had a discussion with the patient regarding further work-up including imaging but she agrees since her symptoms have improved as well as her physical exam findings and history of headaches she will forego imaging at this time.  She knows to return for any worsening symptoms   Patient is hemodynamically stable, in NAD, and able to ambulate in the ED. Evaluation does not show pathology that would require ongoing emergent intervention or inpatient treatment. I explained the  diagnosis to the patient. Pain has been managed and has no complaints prior to discharge. Patient is comfortable with above plan and is stable for discharge at this time. All questions were answered prior to disposition. Strict return precautions for returning to the ED were discussed. Encouraged follow up with PCP.   An After Visit Summary was printed and given to the patient.   Portions of this note were generated with [employer]. Dictation errors may occur despite best attempts at proofreading.  Final Clinical Impression(s) / ED Diagnoses Final diagnoses:  Tension headache    Rx / DC Orders ED Discharge Orders     None        Delia Heady, PA-C 03/28/21 1930    Drenda Freeze, MD 03/28/21 2317

## 2021-03-28 NOTE — ED Notes (Signed)
Pt ambulatory with steady gait to restroom 

## 2021-03-28 NOTE — ED Triage Notes (Signed)
Pt has had intermittent headache all week. States today worsened. Hx of migraines but hasnt had a flare in years. Took excedrin migraine without relief.

## 2021-03-28 NOTE — ED Notes (Signed)
ED Provider at bedside. 

## 2021-04-16 ENCOUNTER — Other Ambulatory Visit: Payer: Self-pay | Admitting: Cardiology

## 2022-06-25 ENCOUNTER — Ambulatory Visit (HOSPITAL_COMMUNITY)
Admission: RE | Admit: 2022-06-25 | Discharge: 2022-06-25 | Disposition: A | Discharge status: Home / Self Care | Payer: BC Managed Care – PPO | Source: Ambulatory Visit | Attending: Internal Medicine | Admitting: Internal Medicine

## 2022-06-25 ENCOUNTER — Encounter (HOSPITAL_COMMUNITY): Payer: Self-pay | Admitting: Internal Medicine

## 2022-06-25 VITALS — BP 104/70 | HR 90 | Wt 274.6 lb

## 2022-06-25 DIAGNOSIS — R600 Localized edema: Secondary | ICD-10-CM

## 2022-06-25 DIAGNOSIS — C50911 Malignant neoplasm of unspecified site of right female breast: Secondary | ICD-10-CM | POA: Diagnosis not present

## 2022-06-25 LAB — BASIC METABOLIC PANEL
Anion gap: 10 (ref 5–15)
BUN: 16 mg/dL (ref 6–20)
CO2: 28 mmol/L (ref 22–32)
Calcium: 9.1 mg/dL (ref 8.9–10.3)
Chloride: 99 mmol/L (ref 98–111)
Creatinine, Ser: 0.67 mg/dL (ref 0.44–1.00)
GFR, Estimated: >60 mL/min (ref >60)
Glucose, Bld: 101 mg/dL — ABNORMAL HIGH (ref 70–99)
Potassium: 3.1 mmol/L — ABNORMAL LOW (ref 3.5–5.1)
Sodium: 137 mmol/L (ref 135–145)

## 2022-06-25 LAB — BRAIN NATRIURETIC PEPTIDE: B Natriuretic Peptide: 14.8 pg/mL (ref 0.0–100.0)

## 2022-06-25 MED ORDER — FUROSEMIDE 20 MG PO TABS: 20.0000 mg | ORAL_TABLET | Freq: Two times a day (BID) | ORAL | 3 refills | Status: AC

## 2022-06-25 NOTE — Patient Instructions (Signed)
DECREASE Lasix to 20 mg Twice daily, if not worse you can decrease to 20 mg daily   Labs done today, your results will be available in MyChart, we will contact you for abnormal readings.  PLEASE WEAR COMPRESSION SOCKS.  Your physician recommends that you schedule a follow-up appointment in: 3 months ( April 2024) ** please call the office in February to arrange your follow up appointment. **  If you have any questions or concerns before your next appointment please send [place] a message through [place] or call our office at [phone].    TO LEAVE A MESSAGE FOR THE NURSE SELECT OPTION 2, PLEASE LEAVE A MESSAGE INCLUDING: YOUR NAME DATE OF BIRTH CALL BACK NUMBER REASON FOR CALL**this is important as we prioritize the call backs  YOU WILL RECEIVE A CALL BACK THE SAME DAY AS LONG AS YOU CALL BEFORE 4:00 PM  At the [facility], you and your health needs are our priority. As part of our continuing mission to provide you with exceptional heart care, we have created designated Provider Care Teams. These Care Teams include your primary Cardiologist (physician) and Advanced Practice Providers (APPs- Physician Assistants and Nurse Practitioners) who all work together to provide you with the care you need, when you need it.   You may see any of the following providers on your designated Care Team at your next follow up: Dr Glori Bickers Dr Loralie Champagne Dr. Roxana Hires, NP Lyda Jester, [place] [facility] [place], [place] Forestine Na, NP Audry Riles, PharmD   Please be sure to bring in all your medications bottles to every appointment.

## 2022-06-25 NOTE — Progress Notes (Signed)
CARDIO-ONCOLOGY CLINIC CONSULT NOTE  Referring Physician:  Dr. Marcy Panning Primary Care: Katie Gottron, NP (HP) Primary Cardiologist: New  HPI:  Katie Curry is a 52 y.o. female with morbid obesity, right breast cancer referred by Dr. Humphrey Rolls for enrollment into the Cardio-Oncology program and further evaluation of LE edema.   Mammogram 6/23 showed in the right breast a spiculated mass in the central posterior right breast left breast was negative. Targeted ultrasound showed irregular mass at the right breast 1130 o'clock 2 cm from nipple measuring 0.9 x 1 x 1.1 cm correlating to the mammographic mass. Ultrasound of the right axilla was negative. Patient was recommended ultrasound-guided biopsy which was performed on 12/04/2021 the pathology showed invasive ductal carcinoma, grade 2, tumor was ER +80%, PR +30%, HER2 +2 FISH for HER2 positive and Ki-67 18%. Patient's case was discussed at tumor board. She was recommended genetic counseling and she was also seen by Dr. Morley Kos on 12/09/2021. She has been seen by Dr. Vick Frees from radiation oncology. Overall she is doing well. She still has some itching in the breasts. And some tenderness. No nipple discharge. Denies any headaches double vision blurring of vision fevers chills night sweats. No musculoskeletal discomfort. ECOG 0   01/13/2022 status post right breast lumpectomy performed by Dr. Al Corpus:   Status post adjuvant chemotherapy with [facility] initiated 02/11/2022 -05/07/2022. Cycle 6 was not administered secondary to retesting of patient's HER2 receptor, results. FISH negative -> so not treated with f/u Herceptin (low risk)  No Herceptin since 05/07/22  Referred here for evaluation of edema.   In November was very fatigued, SOB, weak. + edema and weight gain. Started on lasix. Lasted about 3 weeks before she began to improve. Had recurrent edema in hands > feet. 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 Now taking lasix 20 tid. Edema improved. Trying  to drink 1-2 40 ozcups of water today (but not drinking that much). Mild snoring.  BNP 25  ECHO: 06/03/22 EF 65-70% GLS -26.5% RV normal Mild TR RVSP 28  Heart monitor 11.23: Reportedly normal (I can't see result)    Review of Systems: [y] = yes, '[ ]'$  = no   General: Weight gain Blue.Reese ]; Weight loss '[ ]'$ ; Anorexia '[ ]'$ ; Fatigue [ y]; Fever '[ ]'$ ; Chills '[ ]'$ ; Weakness '[ ]'$   Cardiac: Chest pain/pressure '[ ]'$ ; Resting SOB '[ ]'$ ; Exertional SOB '[ ]'$ ; Orthopnea '[ ]'$ ; Pedal Edema Blue.Reese ]; Palpitations '[ ]'$ ; Syncope '[ ]'$ ; Presyncope '[ ]'$ ; Paroxysmal nocturnal dyspnea'[ ]'$   Pulmonary: Cough '[ ]'$ ; Wheezing'[ ]'$ ; Hemoptysis'[ ]'$ ; Sputum '[ ]'$ ; Snoring '[ ]'$   GI: Vomiting'[ ]'$ ; Dysphagia'[ ]'$ ; Melena'[ ]'$ ; Hematochezia '[ ]'$ ; Heartburn'[ ]'$ ; Abdominal pain '[ ]'$ ; Constipation '[ ]'$ ; Diarrhea '[ ]'$ ; BRBPR '[ ]'$   GU: Hematuria'[ ]'$ ; Dysuria '[ ]'$ ; Nocturia'[ ]'$   Vascular: Pain in legs with walking '[ ]'$ ; Pain in feet with lying flat '[ ]'$ ; Non-healing sores '[ ]'$ ; Stroke '[ ]'$ ; TIA '[ ]'$ ; Slurred speech '[ ]'$ ;  Neuro: Headaches'[ ]'$ ; Vertigo'[ ]'$ ; Seizures'[ ]'$ ; Paresthesias'[ ]'$ ;Blurred vision '[ ]'$ ; Diplopia '[ ]'$ ; Vision changes '[ ]'$   Ortho/Skin: Arthritis '[ ]'$ ; Joint pain '[ ]'$ ; Muscle pain '[ ]'$ ; Joint swelling '[ ]'$ ; Back Pain '[ ]'$ ; Rash '[ ]'$   Psych: Depression'[ ]'$ ; Anxiety'[ ]'$   Heme: Bleeding problems '[ ]'$ ; Clotting disorders '[ ]'$ ; Anemia '[ ]'$   Endocrine: Diabetes '[ ]'$ ; Thyroid dysfunction'[ ]'$    Past Medical History:  Diagnosis Date   Anxiety    no meds  COVID-19     Current Outpatient Medications  Medication Sig Dispense Refill   acetaminophen (TYLENOL) 500 MG tablet Take 1 tablet (500 mg total) by mouth every 6 (six) hours as needed. 30 tablet 0   anastrozole (ARIMIDEX) 1 MG tablet Take 1 mg by mouth daily.     azelastine (ASTELIN) 0.1 % nasal spray Place 2 sprays into both nostrils daily. Use in each nostril as directed     famotidine (PEPCID) 20 MG tablet Take 20 mg by mouth 2 (two) times daily.     furosemide (LASIX) 20 MG tablet Take 20 mg by mouth in the morning, at noon, and  at bedtime.     gabapentin (NEURONTIN) 100 MG capsule Take 200 mg by mouth 3 (three) times daily.     levocetirizine (XYZAL) 5 MG tablet Take 10 mg by mouth daily.     magnesium oxide (MAG-OX) 400 (240 Mg) MG tablet Take 400 mg by mouth in the morning, at noon, in the evening, and at bedtime.     metoprolol tartrate (LOPRESSOR) 25 MG tablet Take 25 mg by mouth 2 (two) times daily.     triamcinolone cream (KENALOG) 0.1 % Apply 1 Application topically 2 (two) times daily.     No current facility-administered medications for this encounter.    Allergies  Allergen Reactions   Azithromycin Hives   Food Hives and Swelling    [place] nuts & coconut   Sulfa Antibiotics       Social History   Socioeconomic History   Marital status: Married    Spouse name: Not on file   Number of children: Not on file   Years of education: Not on file   Highest education level: Not on file  Occupational History   Not on file  Tobacco Use   Smoking status: Never   Smokeless tobacco: Never  Vaping Use   Vaping Use: Never used  Substance and Sexual Activity   Alcohol use: Yes    Comment: occasionaly   Drug use: No   Sexual activity: Not on file  Other Topics Concern   Not on file  Social History Narrative   Not on file   Social Determinants of Health   Financial Resource Strain: Not on file  Food Insecurity: Not on file  Transportation Needs: Not on file  Physical Activity: Not on file  Stress: Not on file  Social Connections: Not on file  Intimate Partner Violence: Not on file     FHx:  Mom - mild CHF, HTN @ age 23 Sister - HTN, DM2, CVA  Vitals:   06/25/22 1141  BP: 104/70  Pulse: 90  SpO2: 96%  Weight: 124.6 kg (274 lb 9.6 oz)    PHYSICAL EXAM: General:  Well appearing. No respiratory difficulty HEENT: normal Neck: supple. no JVD. Carotids 2+ bilat; no bruits. No lymphadenopathy or thryomegaly appreciated. Cor: PMI nondisplaced. Regular rate & rhythm. No rubs, gallops or  murmurs. Lungs: clear Abdomen: obese soft, nontender, nondistended. No hepatosplenomegaly. No bruits or masses. Good bowel sounds. Extremities: no cyanosis, clubbing, rash, tr edema  RUE lymphedema  Neuro: alert & oriented x 3, cranial nerves grossly intact. moves all 4 extremities w/o difficulty. Affect pleasant.  ECG: NSR 91 poor R wave progression Personally reviewed    ASSESSMENT & PLAN:  1. LE edema  - suspect edema mostly related to venous insufficiency. No evidence of HF.  - ECHO: 06/03/22 EF 65-70% GLS -26.5% RV normal Mild TR RVSP 28  -  Suggested compression hose and LE elevation. - I would try to wean lasix as tolerated. Cut back to 20 bid then 20 daily if no change - check labs   2. Right Breast Cancer - 01/13/2022 status post right breast lumpectomy performed by Dr. Al Corpus:  - s/p adjuvant chemotherapy with [facility] initiated 02/11/2022 -05/07/2022. Cycle 6 was not administered secondary to retesting of patient's HER2 receptor, results. FISH negative -> so not treated with f/u Herceptin (low risk)  Katie Bickers, MD  12:11 PM

## 2022-06-26 ENCOUNTER — Telehealth (HOSPITAL_COMMUNITY): Payer: Self-pay

## 2022-06-26 ENCOUNTER — Other Ambulatory Visit (HOSPITAL_COMMUNITY): Payer: Self-pay

## 2022-06-26 MED ORDER — POTASSIUM CHLORIDE CRYS ER 20 MEQ PO TBCR: 20.0000 meq | EXTENDED_RELEASE_TABLET | Freq: Every day | ORAL | 2 refills | Status: DC

## 2022-06-26 NOTE — Telephone Encounter (Signed)
Patient aware and agreeable. Prescription sent in.

## 2022-09-17 ENCOUNTER — Other Ambulatory Visit (HOSPITAL_COMMUNITY): Payer: Self-pay | Admitting: Internal Medicine

## 2022-10-13 ENCOUNTER — Other Ambulatory Visit (HOSPITAL_COMMUNITY): Payer: Self-pay | Admitting: Internal Medicine
